# Patient Record
Sex: Female | Born: 1985 | Race: White | Hispanic: Yes | Marital: Married | Smoking: Never smoker
Health system: Southern US, Community
[De-identification: ages and names within clinical notes are randomized; demographics above are authoritative.]

## PROBLEM LIST (undated history)

## (undated) DIAGNOSIS — Z789 Other specified health status: Secondary | ICD-10-CM

## (undated) HISTORY — PX: NO PAST SURGERIES: SHX2092

---

## 2005-07-16 ENCOUNTER — Inpatient Hospital Stay (HOSPITAL_COMMUNITY): Admission: AD | Admit: 2005-07-16 | Discharge: 2005-07-16 | Payer: Self-pay | Admitting: Obstetrics & Gynecology

## 2005-07-18 ENCOUNTER — Inpatient Hospital Stay (HOSPITAL_COMMUNITY): Admission: AD | Admit: 2005-07-18 | Discharge: 2005-07-18 | Payer: Self-pay | Admitting: *Deleted

## 2005-07-28 ENCOUNTER — Inpatient Hospital Stay (HOSPITAL_COMMUNITY): Admission: AD | Admit: 2005-07-28 | Discharge: 2005-07-28 | Payer: Self-pay | Admitting: Obstetrics and Gynecology

## 2005-11-15 ENCOUNTER — Emergency Department (HOSPITAL_COMMUNITY): Admission: EM | Admit: 2005-11-15 | Discharge: 2005-11-15 | Payer: Self-pay | Admitting: Emergency Medicine

## 2005-11-15 ENCOUNTER — Inpatient Hospital Stay (HOSPITAL_COMMUNITY): Admission: AD | Admit: 2005-11-15 | Discharge: 2005-11-15 | Payer: Self-pay | Admitting: Family Medicine

## 2014-01-09 ENCOUNTER — Other Ambulatory Visit (HOSPITAL_COMMUNITY): Payer: Self-pay | Admitting: *Deleted

## 2014-01-09 DIAGNOSIS — N6320 Unspecified lump in the left breast, unspecified quadrant: Secondary | ICD-10-CM

## 2014-01-28 ENCOUNTER — Encounter (HOSPITAL_COMMUNITY): Payer: Self-pay | Admitting: *Deleted

## 2014-01-30 ENCOUNTER — Ambulatory Visit (HOSPITAL_COMMUNITY)
Admission: RE | Admit: 2014-01-30 | Discharge: 2014-01-30 | Disposition: A | Discharge status: Home / Self Care | Payer: Self-pay | Source: Ambulatory Visit | Attending: Obstetrics and Gynecology | Admitting: Obstetrics and Gynecology

## 2014-01-30 ENCOUNTER — Ambulatory Visit
Admission: RE | Admit: 2014-01-30 | Discharge: 2014-01-30 | Disposition: A | Discharge status: Home / Self Care | Payer: No Typology Code available for payment source | Source: Ambulatory Visit | Attending: Obstetrics and Gynecology | Admitting: Obstetrics and Gynecology

## 2014-01-30 ENCOUNTER — Other Ambulatory Visit (HOSPITAL_COMMUNITY): Payer: Self-pay | Admitting: Obstetrics and Gynecology

## 2014-01-30 ENCOUNTER — Encounter (HOSPITAL_COMMUNITY): Payer: Self-pay

## 2014-01-30 VITALS — BP 108/64 | Ht 59.0 in | Wt 120.2 lb

## 2014-01-30 DIAGNOSIS — Z01419 Encounter for gynecological examination (general) (routine) without abnormal findings: Secondary | ICD-10-CM

## 2014-01-30 DIAGNOSIS — N6320 Unspecified lump in the left breast, unspecified quadrant: Secondary | ICD-10-CM

## 2014-01-30 DIAGNOSIS — N898 Other specified noninflammatory disorders of vagina: Secondary | ICD-10-CM

## 2014-01-30 DIAGNOSIS — N6321 Unspecified lump in the left breast, upper outer quadrant: Secondary | ICD-10-CM

## 2014-01-30 NOTE — Patient Instructions (Signed)
Explained to Morgan Lawson that [place] will cover Pap smears and co-testing every 5 years unless has a history of abnormal Pap smears. Referred patient to the [facility] for a left breast ultrasound. Appointment scheduled for January 30, 2014 at 1340. Patient aware of appointment and will be there. Let patient know will follow up with her within the next couple weeks with results of Pap smear and wet prep by phone. Morgan Lawson verbalized understanding.

## 2014-01-30 NOTE — Addendum Note (Signed)
Encounter addended by: Loletta Parish, RN on: 01/30/2014  3:57 PM<BR>     Documentation filed: Visit Diagnoses

## 2014-01-30 NOTE — Progress Notes (Signed)
Patient referred to [facility] by the [facility] Department to follow-up for a left breast lump.   Pap Smear:  Completed Pap smear today. Last Pap smear was 10/22/2010 at the [facility] and normal. Per patient has no history of an abnormal Pap smear. Last Pap smear result is scanned into EPIC under media.  Physical exam: Breasts Breasts symmetrical. No skin abnormalities bilateral breasts. No nipple retraction bilateral breasts. No nipple discharge bilateral breasts. No lymphadenopathy. No lumps palpated right breast. Palpated a moveable left breast lump at 2 o'clock 6 cm from the nipple. No complaints of pain or tenderness on exam. Referred patient to the [facility] for a left breast ultrasound. Appointment scheduled for January 30, 2014 at 1340.         Pelvic/Bimanual   Ext Genitalia No lesions, no swelling and no discharge observed on external genitalia.         Vagina Vagina pink and normal texture. No lesions and thick white discharge observed in vagina. Wet prep completed.          Cervix Cervix is present. Cervix pink and of normal texture. Cervix friable. Small amount of thick white discharge observed on cervix.       Uterus Uterus is present and palpable. Uterus in normal position and normal size.       Adnexae Bilateral ovaries present and palpable. No tenderness on palpation.        Rectovaginal No rectal exam completed today since patient had no rectal complaints. Small hemorrhoid observed on rectal area.

## 2014-01-30 NOTE — Addendum Note (Signed)
Encounter addended by: Rondell Reams, LPN on: 18/34/3735  7:89 PM<BR>     Documentation filed: Dx Association, Orders

## 2014-02-03 LAB — CYTOLOGY - PAP

## 2014-02-11 ENCOUNTER — Telehealth (HOSPITAL_COMMUNITY): Payer: Self-pay | Admitting: *Deleted

## 2014-02-11 NOTE — Telephone Encounter (Signed)
Telephoned patient at home # and discussed negative pap smear results. HPV was negative. Wet prep was also negative. Next pap due in 3 years. Patient voiced understanding.

## 2014-02-12 ENCOUNTER — Other Ambulatory Visit (HOSPITAL_COMMUNITY): Payer: Self-pay | Admitting: Obstetrics and Gynecology

## 2014-02-12 DIAGNOSIS — N6320 Unspecified lump in the left breast, unspecified quadrant: Secondary | ICD-10-CM

## 2014-02-17 ENCOUNTER — Ambulatory Visit
Admission: RE | Admit: 2014-02-17 | Discharge: 2014-02-17 | Disposition: A | Discharge status: Home / Self Care | Payer: No Typology Code available for payment source | Source: Ambulatory Visit | Attending: Obstetrics and Gynecology | Admitting: Obstetrics and Gynecology

## 2014-02-17 DIAGNOSIS — N6320 Unspecified lump in the left breast, unspecified quadrant: Secondary | ICD-10-CM

## 2014-02-18 ENCOUNTER — Encounter (HOSPITAL_COMMUNITY): Payer: Self-pay | Admitting: *Deleted

## 2014-07-15 ENCOUNTER — Other Ambulatory Visit: Payer: Self-pay

## 2014-07-15 ENCOUNTER — Other Ambulatory Visit (HOSPITAL_COMMUNITY): Payer: Self-pay | Admitting: *Deleted

## 2014-07-15 DIAGNOSIS — N6320 Unspecified lump in the left breast, unspecified quadrant: Secondary | ICD-10-CM

## 2014-08-07 ENCOUNTER — Ambulatory Visit
Admission: RE | Admit: 2014-08-07 | Discharge: 2014-08-07 | Disposition: A | Discharge status: Home / Self Care | Payer: No Typology Code available for payment source | Source: Ambulatory Visit | Attending: Obstetrics and Gynecology | Admitting: Obstetrics and Gynecology

## 2014-08-07 ENCOUNTER — Ambulatory Visit (HOSPITAL_COMMUNITY)
Admission: RE | Admit: 2014-08-07 | Discharge: 2014-08-07 | Disposition: A | Discharge status: Home / Self Care | Payer: Self-pay | Source: Ambulatory Visit | Attending: Obstetrics and Gynecology | Admitting: Obstetrics and Gynecology

## 2014-08-07 ENCOUNTER — Encounter (HOSPITAL_COMMUNITY): Payer: Self-pay

## 2014-08-07 VITALS — BP 102/60 | Temp 99.1°F | Ht 59.0 in | Wt 123.2 lb

## 2014-08-07 DIAGNOSIS — N6320 Unspecified lump in the left breast, unspecified quadrant: Secondary | ICD-10-CM

## 2014-08-07 DIAGNOSIS — Z1239 Encounter for other screening for malignant neoplasm of breast: Secondary | ICD-10-CM

## 2014-08-07 NOTE — Patient Instructions (Signed)
Educational materials on self breast awareness given. Explained to Morgan Lawson that [place] will cover Pap smears and co-testing every 5 years unless has a history of abnormal Pap smears. Referred patient to the [facility] for left breast ultrasound. Appointment scheduled for Thursday, Aug 07, 2014 at 1510. Patient aware of appointment and will be there. Morgan Lawson verbalized understanding.

## 2014-08-07 NOTE — Progress Notes (Addendum)
Complaints of left breast burning pain x 1 week that comes and goes. Patient rated pain at a 5 out of 10.  Pap Smear: Pap smear not completed today. Last Pap smear was 01/30/2014 at [facility] and normal with negative HPV. Per patient has no history of an abnormal Pap smear. Last two Pap smear results in EPIC.  Physical exam: Breasts Breasts symmetrical. No skin abnormalities bilateral breasts. No nipple retraction bilateral breasts. No nipple discharge bilateral breasts. No lymphadenopathy. No lumps palpated right breast. Palpated a lump within the left breast at 2:30 o'clock 5 cm from the nipple. Complaints of tenderness when palpated lump within the left breast. Referred patient to the [facility] for left breast ultrasound. Appointment scheduled for Thursday, Aug 07, 2014 at 1510.    Pelvic/Bimanual No Pap smear completed today since last Pap smear was 01/30/2014. Pap smear not indicated per BCCCP guidelines.   Used interpreter Benjamine Sprague.

## 2014-08-07 NOTE — Addendum Note (Signed)
Encounter addended by: Loletta Parish, RN on: 08/07/2014  3:25 PM<BR>     Documentation filed: Notes Section

## 2014-08-13 ENCOUNTER — Encounter (HOSPITAL_COMMUNITY): Payer: Self-pay | Admitting: Family Medicine

## 2014-08-13 ENCOUNTER — Emergency Department (HOSPITAL_COMMUNITY)
Admission: EM | Admit: 2014-08-13 | Discharge: 2014-08-13 | Disposition: A | Discharge status: Home / Self Care | Payer: Self-pay | Attending: Emergency Medicine | Admitting: Emergency Medicine

## 2014-08-13 ENCOUNTER — Emergency Department (HOSPITAL_COMMUNITY): Payer: Self-pay

## 2014-08-13 DIAGNOSIS — R079 Chest pain, unspecified: Secondary | ICD-10-CM

## 2014-08-13 DIAGNOSIS — R0789 Other chest pain: Secondary | ICD-10-CM | POA: Insufficient documentation

## 2014-08-13 LAB — CBC WITH DIFFERENTIAL/PLATELET
Basophils Absolute: 0 10*3/uL (ref 0.0–0.1)
Basophils Relative: 0 % (ref 0–1)
Eosinophils Absolute: 0.1 10*3/uL (ref 0.0–0.7)
Eosinophils Relative: 1 % (ref 0–5)
HEMATOCRIT: 42.6 % (ref 36.0–46.0)
HEMOGLOBIN: 15.1 g/dL — AB (ref 12.0–15.0)
LYMPHS ABS: 2.1 10*3/uL (ref 0.7–4.0)
Lymphocytes Relative: 25 % (ref 12–46)
MCH: 30.2 pg (ref 26.0–34.0)
MCHC: 35.4 g/dL (ref 30.0–36.0)
MCV: 85.2 fL (ref 78.0–100.0)
MONO ABS: 0.5 10*3/uL (ref 0.1–1.0)
Monocytes Relative: 6 % (ref 3–12)
Neutro Abs: 5.8 10*3/uL (ref 1.7–7.7)
Neutrophils Relative %: 68 % (ref 43–77)
PLATELETS: 247 10*3/uL (ref 150–400)
RBC: 5 MIL/uL (ref 3.87–5.11)
RDW: 12.7 % (ref 11.5–15.5)
WBC: 8.5 10*3/uL (ref 4.0–10.5)

## 2014-08-13 LAB — BASIC METABOLIC PANEL
ANION GAP: 8 (ref 5–15)
BUN: 9 mg/dL (ref 6–20)
CO2: 22 mmol/L (ref 22–32)
CREATININE: 0.45 mg/dL (ref 0.44–1.00)
Calcium: 9.1 mg/dL (ref 8.9–10.3)
Chloride: 106 mmol/L (ref 101–111)
GFR calc non Af Amer: >60 mL/min (ref >60)
Glucose, Bld: 86 mg/dL (ref 65–99)
POTASSIUM: 3.9 mmol/L (ref 3.5–5.1)
SODIUM: 136 mmol/L (ref 135–145)

## 2014-08-13 LAB — D-DIMER, QUANTITATIVE (NOT AT ARMC)

## 2014-08-13 MED ORDER — IBUPROFEN 600 MG PO TABS: 600.0000 mg | ORAL_TABLET | Freq: Four times a day (QID) | ORAL | Status: DC | PRN

## 2014-08-13 NOTE — ED Notes (Signed)
Pt is in stable condition upon d/c and ambulates from ED. 

## 2014-08-13 NOTE — Discharge Instructions (Signed)
Dolor de la pared torcica (Chest Wall Pain) Dolor en la pared torcica es dolor en o alrededor de los huesos y msculos de su pecho. Podrn pasar hasta 6 semanas hasta que comience a mejorar. Puede demorar ms tiempo si es fsicamente activo en su [place].  CAUSAS  El dolor en el pecho puede aparecer sin motivo. No obstante, algunas causas pueden ser:   Ardelia Mems enfermedad viral como la gripe.  Traumatismos.  Tos.  La prctica de ejercicios.  Artritis.  Fibromialgia  [place]. INSTRUCCIONES PARA EL CUIDADO DOMICILIARIO  Evite hacer actividad fsica extenuante. Trate de no esforzarse o Librarian, academic. Aqu se incluyen las actividades en las que [place] los msculos del trax, los abdominales y los msculos laterales, especialmente si debe levantar objetos pesados.  Aplique hielo sobre la zona dolorida.  Ponga el hielo en una bolsa plstica.  Colquese una toalla entre la piel y la bolsa de hielo.  Deje la bolsa de hielo durante 15 a 20 minutos por hora, durante los primeros 2 das.  Utilice los medicamentos de venta libre o de prescripcin para Conservation officer, historic buildings, Health and safety inspector o la [place], segn se lo indique el profesional que lo asiste. SOLICITE ATENCIN MDICA DE INMEDIATO SI:  El dolor aumenta o siente muchas molestias.  Tiene fiebre.  El dolor de [place].  Desarrolla nuevos e inexplicables sntomas.  Tiene nuseas o vmitos.  Philbert Riser o se siente mareado.  Tiene tos con flema (esputo), o tose con sangre. EST SEGURO QUE:   Comprende las instrucciones para el alta mdica.  Controlar su enfermedad.  Solicitar atencin mdica de inmediato segn las indicaciones. Document Released: 04/11/2006 Document Revised: 05/23/2011 [facility] Patient Information 2015 [place]. This information is not intended to replace advice given to you by your health care provider. Make sure you discuss any questions you have with your health care  provider.

## 2014-08-13 NOTE — ED Notes (Signed)
Pt here for left sided chest pain through to her back. sts some pain in the arm for the last few weeks. sts the left leg is numb. She was see recently and told the lump in breast is not related to pain. sts hurts to touch and with breathing.

## 2014-08-13 NOTE — ED Provider Notes (Signed)
CSN: [id number]     Arrival date & time 08/13/14  0347 History   First MD Initiated Contact with Patient 08/13/14 [phone]     Chief Complaint  Patient presents with  . Chest Pain     (Consider location/radiation/quality/duration/timing/severity/associated sxs/prior Treatment) HPI Comments: Patient with no PMH presents to the ED with chief complaint of chest pain 1 month. She states the pain is located on the left side of her chest. She reports associated pain with deep breathing as well as with palpation. She states that occasionally she has numbness that radiates to her left arm and to her left leg. She denies any known mechanism of injury. She states that she was seen by her regular doctor, and was prescribed an anti-inflammatory for chest wall inflammation. She states that this helped a little bit. She states that she is concerned about the persistent nature of the pain. She denies fevers, chills, cough, shortness breath, or abdominal pain. She denies any history of ACS or PE or DVT. She is currently using birth control.  The history is provided by the patient. No language interpreter was used.    History reviewed. No pertinent past medical history. History reviewed. No pertinent past surgical history. Family History  Problem Relation Age of Onset  . Seizures Sister   . Diabetes Paternal Grandfather    History  Substance Use Topics  . Smoking status: Never Smoker   . Smokeless tobacco: Never Used  . Alcohol Use: No   OB History    Gravida Para Term Preterm AB TAB SAB Ectopic Multiple Living   2 2 2       2      Review of Systems  Constitutional: Negative for fever and chills.  Respiratory: Negative for shortness of breath.   Cardiovascular: Positive for chest pain.  Gastrointestinal: Negative for nausea, vomiting, diarrhea and constipation.  Genitourinary: Negative for dysuria.  All other systems reviewed and are negative.     Allergies  Review of patient's allergies  indicates no known allergies.  Home Medications   Prior to Admission medications   Medication Sig Start Date End Date Taking? Authorizing Provider  ibuprofen (ADVIL,MOTRIN) 200 MG tablet Take 400 mg by mouth every 6 (six) hours as needed for mild pain or moderate pain.   Yes Historical Provider, MD   BP 109/70 mmHg  Pulse 81  Temp(Src) 98.7 F (37.1 C) (Oral)  Resp 18  SpO2 99%  LMP 08/07/2014 Physical Exam  Constitutional: She is oriented to person, place, and time. She appears well-developed and well-nourished.  HENT:  Head: Normocephalic and atraumatic.  Eyes: Conjunctivae and EOM are normal. Pupils are equal, round, and reactive to light.  Neck: Normal range of motion. Neck supple.  Cardiovascular: Normal rate and regular rhythm.  Exam reveals no gallop and no friction rub.   No murmur heard. Pulmonary/Chest: Effort normal and breath sounds normal. No respiratory distress. She has no wheezes. She has no rales. She exhibits no tenderness.  Clear to auscultation bilaterally  Abdominal: Soft. Bowel sounds are normal. She exhibits no distension and no mass. There is no tenderness. There is no rebound and no guarding.  No abdominal tenderness  Musculoskeletal: Normal range of motion. She exhibits no edema or tenderness.  No leg swelling, or leg tenderness  Neurological: She is alert and oriented to person, place, and time.  Skin: Skin is warm and dry.  Psychiatric: She has a normal mood and affect. Her behavior is normal. Judgment and thought content  normal.  Nursing note and vitals reviewed.   ED Course  Procedures (including critical care time) Results for orders placed or performed during the hospital encounter of 08/13/14  CBC with Differential/Platelet  Result Value Ref Range   WBC 8.5 4.0 - 10.5 K/uL   RBC 5.00 3.87 - 5.11 MIL/uL   Hemoglobin 15.1 (H) 12.0 - 15.0 g/dL   HCT 42.6 36.0 - 46.0 %   MCV 85.2 78.0 - 100.0 fL   MCH 30.2 26.0 - 34.0 pg   MCHC 35.4 30.0 -  36.0 g/dL   RDW 12.7 11.5 - 15.5 %   Platelets 247 150 - 400 K/uL   Neutrophils Relative % 68 43 - 77 %   Neutro Abs 5.8 1.7 - 7.7 K/uL   Lymphocytes Relative 25 12 - 46 %   Lymphs Abs 2.1 0.7 - 4.0 K/uL   Monocytes Relative 6 3 - 12 %   Monocytes Absolute 0.5 0.1 - 1.0 K/uL   Eosinophils Relative 1 0 - 5 %   Eosinophils Absolute 0.1 0.0 - 0.7 K/uL   Basophils Relative 0 0 - 1 %   Basophils Absolute 0.0 0.0 - 0.1 K/uL  Basic metabolic panel  Result Value Ref Range   Sodium 136 135 - 145 mmol/L   Potassium 3.9 3.5 - 5.1 mmol/L   Chloride 106 101 - 111 mmol/L   CO2 22 22 - 32 mmol/L   Glucose, Bld 86 65 - 99 mg/dL   BUN 9 6 - 20 mg/dL   Creatinine, Ser 0.45 0.44 - 1.00 mg/dL   Calcium 9.1 8.9 - 10.3 mg/dL   GFR calc non Af Amer >60 >60 mL/min   GFR calc Af Amer >60 >60 mL/min   Anion gap 8 5 - 15  D-dimer, quantitative (not at [facility])  Result Value Ref Range   D-Dimer, Quant <0.27 0.00 - 0.48 ug/mL-FEU   Dg Chest 2 View  08/13/2014   CLINICAL DATA:  Chest pain for several days  EXAM: CHEST  2 VIEW  COMPARISON:  None.  FINDINGS: Lungs are clear. Heart size and pulmonary vascularity are normal. No adenopathy. No pneumothorax. No bone lesions.  IMPRESSION: No edema or consolidation.   Electronically Signed   By: Lowella Grip III M.D.   On: 08/13/2014 10:16   US Breast Ltd Uni Left Inc Axilla  08/07/2014   CLINICAL DATA:  Patient has waxing and waning breast pain in the lateral aspect of the left breast. Patient had a mass in the upper outer left breast biopsied in December 2015. Pathology revealed a fibroadenoma which is concordant.  EXAM: ULTRASOUND OF THE LEFT BREAST  COMPARISON:  01/30/2014  FINDINGS: On physical exam, there is a palpable mobile smooth mass in the upper outer left breast reflecting the previously biopsied mass. No other discrete palpable mass.  Targeted ultrasound is performed, showing a string of 3 masses with similar echogenicity, all circumscribed, extending  peripherally from the 2:30 position of the left breast. The largest is the previously biopsied mass which measures 1.9 cm x 1.1 cm x 2.4 cm, without significant change allowing differences in measurement technique. The other 2 masses lie adjacent to this, not previously measured, the middle mass measuring 10 mm x 5 mm x 15 mm an the most peripheral mass measuring 8 mm x 4 mm x 11 mm. These have not been ankle echogenicity and are all felt to reflect fibroadenomas.  No abnormality is seen on ultrasound in the lateral left breast  in the region of the will reported pain. Patient is currently not having pain.  IMPRESSION: Benign left breast fibroadenomas.  No evidence of malignancy.  RECOMMENDATION: Screening mammogram at age 19 unless there are persistent or intervening clinical concerns. (Code:SM-B-40A)  I have discussed the findings and recommendations with the patient. Results were also provided in writing at the conclusion of the visit. If applicable, a reminder letter will be sent to the patient regarding the next appointment.  BI-RADS CATEGORY  2: Benign Finding(s)   Electronically Signed   By: Lajean Manes M.D.   On: 08/07/2014 16:05        EKG Interpretation   Date/Time:  Wednesday August 13 2014 08:54:20 EDT Ventricular Rate:  81 PR Interval:  140 QRS Duration: 90 QT Interval:  374 QTC Calculation: 434 R Axis:   82 Text Interpretation:  Normal sinus rhythm Cannot rule out Anterior infarct  , age undetermined Abnormal ECG No previous ECGs available Confirmed by  Christy Gentles  MD, DONALD ([id number]) on 08/13/2014 9:13:55 AM      MDM   Final diagnoses:  Chest pain  Chest wall pain    Patient with persistent chest pain x 1 month. Low risk for ACS, PE, DVT.  Seems to be pleuritic in nature.  NSAIDs have helped in the past.  Consider D-dimer given that PERC cannot be applied to patient.  D-dimer is negative. Chest pain reproducible with palpation. Will treat with NSAIDs, and discharged home.  Return precautions given. Patient understands and agrees with plan. She is stable and ready for discharge.    Montine Circle, PA-C 08/13/14 1356  Ripley Fraise, MD 08/13/14 1420

## 2015-08-04 ENCOUNTER — Other Ambulatory Visit (HOSPITAL_COMMUNITY): Payer: Self-pay | Admitting: *Deleted

## 2015-08-04 DIAGNOSIS — N644 Mastodynia: Secondary | ICD-10-CM

## 2015-08-05 ENCOUNTER — Emergency Department (HOSPITAL_COMMUNITY)
Admission: EM | Admit: 2015-08-05 | Discharge: 2015-08-05 | Disposition: A | Discharge status: Home / Self Care | Payer: No Typology Code available for payment source | Attending: Emergency Medicine | Admitting: Emergency Medicine

## 2015-08-05 ENCOUNTER — Encounter (HOSPITAL_COMMUNITY): Payer: Self-pay

## 2015-08-05 DIAGNOSIS — Y92410 Unspecified street and highway as the place of occurrence of the external cause: Secondary | ICD-10-CM | POA: Insufficient documentation

## 2015-08-05 DIAGNOSIS — Y998 Other external cause status: Secondary | ICD-10-CM | POA: Diagnosis not present

## 2015-08-05 DIAGNOSIS — S3992XA Unspecified injury of lower back, initial encounter: Secondary | ICD-10-CM | POA: Diagnosis not present

## 2015-08-05 DIAGNOSIS — Y9389 Activity, other specified: Secondary | ICD-10-CM | POA: Diagnosis not present

## 2015-08-05 DIAGNOSIS — S0990XA Unspecified injury of head, initial encounter: Secondary | ICD-10-CM | POA: Insufficient documentation
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
## 2015-08-05 NOTE — ED Notes (Signed)
Pt c/o some head pain and lower back pain

## 2015-08-13 ENCOUNTER — Ambulatory Visit
Admission: RE | Admit: 2015-08-13 | Discharge: 2015-08-13 | Disposition: A | Discharge status: Home / Self Care | Payer: No Typology Code available for payment source | Source: Ambulatory Visit | Attending: Obstetrics and Gynecology | Admitting: Obstetrics and Gynecology

## 2015-08-13 ENCOUNTER — Ambulatory Visit (HOSPITAL_COMMUNITY)
Admission: RE | Admit: 2015-08-13 | Discharge: 2015-08-13 | Disposition: A | Discharge status: Home / Self Care | Payer: Self-pay | Source: Ambulatory Visit | Attending: Obstetrics and Gynecology | Admitting: Obstetrics and Gynecology

## 2015-08-13 ENCOUNTER — Encounter (HOSPITAL_COMMUNITY): Payer: Self-pay

## 2015-08-13 VITALS — BP 122/80 | Temp 98.4°F | Ht 63.0 in | Wt 123.6 lb

## 2015-08-13 DIAGNOSIS — N644 Mastodynia: Secondary | ICD-10-CM

## 2015-08-13 DIAGNOSIS — Z1239 Encounter for other screening for malignant neoplasm of breast: Secondary | ICD-10-CM

## 2015-08-13 DIAGNOSIS — N6320 Unspecified lump in the left breast, unspecified quadrant: Secondary | ICD-10-CM

## 2015-08-13 NOTE — Progress Notes (Addendum)
Complaints of left breast pain x 2-3 months that comes and goes. Patient rates pain at a 6 out of 10. Patient complained that she still feels the left breast lump that has been followed since November 2015. Patient states it has increased in size since last mammogram.  Pap Smear: Pap smear not completed today. Last Pap smear was 01/30/2014 at [facility] and normal with negative HPV. Per patient has no history of an abnormal Pap smear. Last two Pap smear results in EPIC.  Physical exam: Breasts Breasts symmetrical. No skin abnormalities bilateral breasts. No nipple retraction bilateral breasts. No nipple discharge bilateral breasts. No lymphadenopathy. No lumps palpated right breast. Palpated a lump within the left breast between 2-3 o'clock. Patient complained of tenderness when palpated lump. Referred patient to the [facility] for diagnostic mammogram. Appointment scheduled for Thursday, August 13, 2015 at 0830.        Pelvic/Bimanual No Pap smear completed today since last Pap smear and HPV typing was 01/30/2014. Pap smear not indicated per BCCCP guidelines.   Smoking History: Patient has never smoked.  Patient Navigation: Patient education provided. Access to services provided for patient through [facility] program. Spanish interpreter provided.    Used Spanish interpreter Omer Jack from [employer].

## 2015-08-13 NOTE — Patient Instructions (Signed)
Educational materials on self breast awareness given. Explained to Morgan Lawson that she did not need a Pap smear today due to last Pap smear and HPV typing was completed 04/01/2013. Let her know [facility] will cover Pap smears and HPV typing every 5 years unless has a history of abnormal Pap smears. Referred patient to the [facility] for diagnostic mammogram. Appointment scheduled for Thursday, August 13, 2015 at 0830. Morgan Lawson verbalized understanding.  [place], Arvil Chaco, RN 8:47 AM

## 2015-08-14 ENCOUNTER — Encounter (HOSPITAL_COMMUNITY): Payer: Self-pay | Admitting: *Deleted

## 2015-09-09 NOTE — Addendum Note (Signed)
Encounter addended by: Loletta Parish, RN on: 09/09/2015  3:23 PM<BR>     Documentation filed: Notes Section

## 2015-12-03 ENCOUNTER — Inpatient Hospital Stay (HOSPITAL_COMMUNITY): Payer: Self-pay

## 2015-12-03 ENCOUNTER — Ambulatory Visit (HOSPITAL_COMMUNITY)
Admission: EM | Admit: 2015-12-03 | Discharge: 2015-12-03 | Disposition: A | Discharge status: Home / Self Care | Payer: Self-pay

## 2015-12-03 ENCOUNTER — Encounter (HOSPITAL_COMMUNITY): Payer: Self-pay | Admitting: *Deleted

## 2015-12-03 ENCOUNTER — Inpatient Hospital Stay (HOSPITAL_COMMUNITY)
Admission: AD | Admit: 2015-12-03 | Discharge: 2015-12-03 | Disposition: A | Discharge status: Home / Self Care | Payer: Self-pay | Source: Ambulatory Visit | Attending: Obstetrics & Gynecology | Admitting: Obstetrics & Gynecology

## 2015-12-03 ENCOUNTER — Ambulatory Visit (HOSPITAL_COMMUNITY)
Admission: EM | Admit: 2015-12-03 | Discharge: 2015-12-03 | Disposition: A | Discharge status: Nursing Home (Includes ICF) | Payer: Self-pay | Attending: Internal Medicine | Admitting: Internal Medicine

## 2015-12-03 ENCOUNTER — Encounter (HOSPITAL_COMMUNITY): Payer: Self-pay | Admitting: Emergency Medicine

## 2015-12-03 DIAGNOSIS — R102 Pelvic and perineal pain: Secondary | ICD-10-CM

## 2015-12-03 DIAGNOSIS — Z3201 Encounter for pregnancy test, result positive: Secondary | ICD-10-CM

## 2015-12-03 DIAGNOSIS — N939 Abnormal uterine and vaginal bleeding, unspecified: Secondary | ICD-10-CM

## 2015-12-03 DIAGNOSIS — O209 Hemorrhage in early pregnancy, unspecified: Secondary | ICD-10-CM | POA: Insufficient documentation

## 2015-12-03 DIAGNOSIS — O4691 Antepartum hemorrhage, unspecified, first trimester: Secondary | ICD-10-CM

## 2015-12-03 DIAGNOSIS — Z3A09 9 weeks gestation of pregnancy: Secondary | ICD-10-CM | POA: Insufficient documentation

## 2015-12-03 DIAGNOSIS — O3680X0 Pregnancy with inconclusive fetal viability, not applicable or unspecified: Secondary | ICD-10-CM

## 2015-12-03 HISTORY — DX: Other specified health status: Z78.9

## 2015-12-03 LAB — CBC
HCT: 39.1 % (ref 36.0–46.0)
Hemoglobin: 13.9 g/dL (ref 12.0–15.0)
MCH: 30.8 pg (ref 26.0–34.0)
MCHC: 35.5 g/dL (ref 30.0–36.0)
MCV: 86.7 fL (ref 78.0–100.0)
Platelets: 267 10*3/uL (ref 150–400)
RBC: 4.51 MIL/uL (ref 3.87–5.11)
RDW: 13.2 % (ref 11.5–15.5)
WBC: 7.7 10*3/uL (ref 4.0–10.5)

## 2015-12-03 LAB — ABO/RH: ABO/RH(D): O POS

## 2015-12-03 LAB — URINALYSIS, ROUTINE W REFLEX MICROSCOPIC
Bilirubin Urine: NEGATIVE
GLUCOSE, UA: NEGATIVE mg/dL
Ketones, ur: NEGATIVE mg/dL
Leukocytes, UA: NEGATIVE
Nitrite: NEGATIVE
PH: 7.5 (ref 5.0–8.0)
Protein, ur: NEGATIVE mg/dL
SPECIFIC GRAVITY, URINE: 1.015 (ref 1.005–1.030)

## 2015-12-03 LAB — HCG, QUANTITATIVE, PREGNANCY: hCG, Beta Chain, Quant, S: 59 m[IU]/mL — ABNORMAL HIGH (ref <5)

## 2015-12-03 LAB — WET PREP, GENITAL
Clue Cells Wet Prep HPF POC: NONE SEEN
SPERM: NONE SEEN
TRICH WET PREP: NONE SEEN
YEAST WET PREP: NONE SEEN

## 2015-12-03 LAB — POCT PREGNANCY, URINE: Preg Test, Ur: POSITIVE — AB

## 2015-12-03 LAB — URINE MICROSCOPIC-ADD ON

## 2015-12-03 NOTE — ED Notes (Signed)
[facility] Was told to go immediately and not to stop to eat... Pt verb understanding.

## 2015-12-03 NOTE — MAU Provider Note (Signed)
History     CSN: [id number]  Arrival date and time: 12/03/15 1259   First Provider Initiated Contact with Patient 12/03/15 1343        Chief Complaint  Patient presents with  . Vaginal Bleeding  . Pelvic Pain   HPI  Morgan Lawson is a 30 y.o. G3P2002 at [redacted]w[redacted]d by LMP who presents with abdominal pain and vaginal bleeding. LMP was mid July; the end of July she had Nexplanon placed at [facility]. Reports vaginal bleeding that started as a "light period" on Saturday. On Sunday & Monday bleeding became heavy with clots. Since then reports brown spotting.  Lower abdominal cramping started Saturday as well. Pain is across lower abdomen & radiates to low back. Rates pain 6/10. Has been taking ibuprofen with minimal relief.  Some nausea & vomiting, last vomited a few days ago. Denies fever, dysuria, diarrhea, or constipation. Went to Urgent Care today & was sent here d/t positive UPT.   Spanish speaking patient; spanish interpreter at bedside.     OB History    Gravida Para Term Preterm AB Living   3 2 2     2    SAB TAB Ectopic Multiple Live Births           2      History reviewed. No pertinent past medical history.  History reviewed. No pertinent surgical history.  Family History  Problem Relation Age of Onset  . Seizures Sister   . Diabetes Paternal Grandfather     Social History  Substance Use Topics  . Smoking status: Never Smoker  . Smokeless tobacco: Never Used  . Alcohol use No    Allergies: No Known Allergies  Prescriptions Prior to Admission  Medication Sig Dispense Refill Last Dose  . ibuprofen (ADVIL,MOTRIN) 200 MG tablet Take 400 mg by mouth every 6 (six) hours as needed for headache, mild pain or moderate pain.   Past Week at Unknown time    Review of Systems  Constitutional: Negative.   Gastrointestinal: Positive for abdominal pain and nausea. Negative for constipation, diarrhea and vomiting.  Genitourinary: Negative for dysuria.       +  vaginal bleeding  Musculoskeletal: Positive for back pain.   Physical Exam   Blood pressure 132/63, pulse 97, temperature 98.9 F (37.2 C), resp. rate 18, weight 123 lb 12.8 oz (56.2 kg), last menstrual period 07/24/2015.  Physical Exam  Nursing note and vitals reviewed. Constitutional: She is oriented to person, place, and time. She appears well-developed and well-nourished. No distress.  HENT:  Head: Normocephalic and atraumatic.  Eyes: Conjunctivae are normal. Right eye exhibits no discharge. Left eye exhibits no discharge. No scleral icterus.  Neck: Normal range of motion.  Cardiovascular: Normal rate, regular rhythm and normal heart sounds.   No murmur heard. Respiratory: Effort normal and breath sounds normal. No respiratory distress. She has no wheezes.  GI: Soft. Bowel sounds are normal. She exhibits no distension. There is no tenderness. There is no rebound and no guarding.  Genitourinary: Uterus normal. Cervix exhibits no motion tenderness and no friability. Right adnexum displays no mass and no tenderness. Left adnexum displays no mass and no tenderness. There is bleeding (minimal amount of old blood, brown) in the vagina.  Neurological: She is alert and oriented to person, place, and time.  Skin: Skin is warm and dry. She is not diaphoretic.  Psychiatric: She has a normal mood and affect. Her behavior is normal. Judgment and thought content normal.  MAU Course  Procedures Results for orders placed or performed during the hospital encounter of 12/03/15 (from the past 24 hour(s))  CBC     Status: None   Collection Time: 12/03/15  1:23 PM  Result Value Ref Range   WBC 7.7 4.0 - 10.5 K/uL   RBC 4.51 3.87 - 5.11 MIL/uL   Hemoglobin 13.9 12.0 - 15.0 g/dL   HCT 39.1 36.0 - 46.0 %   MCV 86.7 78.0 - 100.0 fL   MCH 30.8 26.0 - 34.0 pg   MCHC 35.5 30.0 - 36.0 g/dL   RDW 13.2 11.5 - 15.5 %   Platelets 267 150 - 400 K/uL  ABO/Rh     Status: None   Collection Time: 12/03/15   1:24 PM  Result Value Ref Range   ABO/RH(D) O POS   hCG, quantitative, pregnancy     Status: Abnormal   Collection Time: 12/03/15  1:24 PM  Result Value Ref Range   hCG, Beta Chain, Quant, S 59 (H) <5 mIU/mL  Wet prep, genital     Status: Abnormal   Collection Time: 12/03/15  1:55 PM  Result Value Ref Range   Yeast Wet Prep HPF POC NONE SEEN NONE SEEN   Trich, Wet Prep NONE SEEN NONE SEEN   Clue Cells Wet Prep HPF POC NONE SEEN NONE SEEN   WBC, Wet Prep HPF POC FEW (A) NONE SEEN   Sperm NONE SEEN   Urinalysis, Routine w reflex microscopic (not at [facility] [place])     Status: Abnormal   Collection Time: 12/03/15  3:45 PM  Result Value Ref Range   Color, Urine YELLOW YELLOW   APPearance CLEAR CLEAR   Specific Gravity, Urine 1.015 1.005 - 1.030   pH 7.5 5.0 - 8.0   Glucose, UA NEGATIVE NEGATIVE mg/dL   Hgb urine dipstick LARGE (A) NEGATIVE   Bilirubin Urine NEGATIVE NEGATIVE   Ketones, ur NEGATIVE NEGATIVE mg/dL   Protein, ur NEGATIVE NEGATIVE mg/dL   Nitrite NEGATIVE NEGATIVE   Leukocytes, UA NEGATIVE NEGATIVE  Urine microscopic-add on     Status: Abnormal   Collection Time: 12/03/15  3:45 PM  Result Value Ref Range   Squamous Epithelial / LPF 0-5 (A) NONE SEEN   WBC, UA 0-5 0 - 5 WBC/hpf   RBC / HPF 0-5 0 - 5 RBC/hpf   Bacteria, UA RARE (A) NONE SEEN   US Ob Comp Less 14 Wks  Result Date: 12/03/2015 CLINICAL DATA:  Vaginal bleeding in early pregnancy. EXAM: OBSTETRIC <14 WK [place] AND TRANSVAGINAL OB US TECHNIQUE: Both transabdominal and transvaginal ultrasound examinations were performed for complete evaluation of the gestation as well as the maternal uterus, adnexal regions, and pelvic cul-de-sac. Transvaginal technique was performed to assess early pregnancy. COMPARISON:  None. FINDINGS: Intrauterine gestational sac: None Yolk sac:  Not visualize Embryo:  Not visualized Cardiac Activity: Heart Rate:   bpm MSD:   mm    w     d CRL:    mm    w    d                  [place] EDC: Subchorionic  hemorrhage:  None visualized. Maternal uterus/adnexae: Endometrium heterogeneous, measuring 14 mm in thickness. No adnexal masses or free fluid. IMPRESSION: No intrauterine pregnancy visualized. Differential considerations would include early intrauterine pregnancy too early to visualize, spontaneous abortion, or occult ectopic pregnancy. Recommend close clinical followup and serial quantitative beta HCGs and ultrasounds. Electronically Signed   By:  Rolm Baptise M.D.   On: 12/03/2015 15:36   US Ob Transvaginal  Result Date: 12/03/2015 CLINICAL DATA:  Vaginal bleeding in early pregnancy. EXAM: OBSTETRIC <14 WK [place] AND TRANSVAGINAL OB US TECHNIQUE: Both transabdominal and transvaginal ultrasound examinations were performed for complete evaluation of the gestation as well as the maternal uterus, adnexal regions, and pelvic cul-de-sac. Transvaginal technique was performed to assess early pregnancy. COMPARISON:  None. FINDINGS: Intrauterine gestational sac: None Yolk sac:  Not visualize Embryo:  Not visualized Cardiac Activity: Heart Rate:   bpm MSD:   mm    w     d CRL:    mm    w    d                  [place] EDC: Subchorionic hemorrhage:  None visualized. Maternal uterus/adnexae: Endometrium heterogeneous, measuring 14 mm in thickness. No adnexal masses or free fluid. IMPRESSION: No intrauterine pregnancy visualized. Differential considerations would include early intrauterine pregnancy too early to visualize, spontaneous abortion, or occult ectopic pregnancy. Recommend close clinical followup and serial quantitative beta HCGs and ultrasounds. Electronically Signed   By: Rolm Baptise M.D.   On: 12/03/2015 15:36    MDM +UPT UA, wet prep, GC/chlamydia, CBC, ABO/Rh, quant hCG, HIV, and [place] today to rule out ectopic pregnancy Ultrasound shows no IUP or adnexal mass -- BHCG only 59  This vaginal bleeding & abdominal pain could represent a normal pregnancy, spontaneous abortion, or even an ectopic pregnancy which can  be life-threatening. Cultures were obtained to rule out pelvic infection.   Assessment and Plan  A: 1. Pregnancy of unknown anatomic location   2. Vaginal bleeding in pregnancy, first trimester     P: Discharge home Return Saturday evening or Sunday morning for repeat BHCG Discussed reasons to return to [place] 12/03/2015, 1:42 PM

## 2015-12-03 NOTE — ED Provider Notes (Signed)
CSN: [id number]     Arrival date & time 12/03/15  1004 History   First MD Initiated Contact with Patient 12/03/15 1041     Chief Complaint  Patient presents with  . Dysmenorrhea   (Consider location/radiation/quality/duration/timing/severity/associated sxs/prior Treatment) 30 year old female presents to the urgent care with complaints of vaginal bleeding and pain for 5 days. History obtained via video interpreter. She states the pain is located in the suprapubic mid pelvic area. She is having vaginal bleeding associated with clots. She is using 30 more pads per day. She has taken ibuprofen for pelvic pain which has temporarily improved the pain. She was recently placed on Implanon and July.  Other complaints include headache, body aches. No vomiting. She states that she has an appointment with her PCP on October 3 but was also advised that she could come in early tomorrow morning to be seen without an appointment.      History reviewed. No pertinent past medical history. History reviewed. No pertinent surgical history. Family History  Problem Relation Age of Onset  . Seizures Sister   . Diabetes Paternal Grandfather    Social History  Substance Use Topics  . Smoking status: Never Smoker  . Smokeless tobacco: Never Used  . Alcohol use No   OB History    Gravida Para Term Preterm AB Living   2 2 2     2    SAB TAB Ectopic Multiple Live Births                 Review of Systems  Constitutional: Positive for activity change. Negative for fever.  HENT: Negative.   Respiratory: Negative.   Cardiovascular: Negative.   Gastrointestinal: Positive for abdominal pain.  Genitourinary: Positive for pelvic pain and vaginal bleeding. Negative for dysuria and frequency.  Musculoskeletal: Positive for myalgias.  Skin: Negative.   Neurological: Negative.     Allergies  Review of patient's allergies indicates no known allergies.  Home Medications   Prior to Admission medications    Not on File   Meds Ordered and Administered this Visit  Medications - No data to display  BP 112/68 (BP Location: Left Arm)   Pulse 78   Temp 98.4 F (36.9 C) (Oral)   Resp 18   SpO2 100%  No data found.   Physical Exam  Constitutional: She is oriented to person, place, and time. She appears well-developed and well-nourished. No distress.  HENT:  Head: Normocephalic and atraumatic.  Eyes: EOM are normal.  Neck: Normal range of motion. Neck supple.  Cardiovascular: Normal rate and regular rhythm.   Pulmonary/Chest: Effort normal. No respiratory distress.  Musculoskeletal: She exhibits no edema.  Neurological: She is alert and oriented to person, place, and time. She exhibits normal muscle tone.  Skin: Skin is warm and dry.  Psychiatric: She has a normal mood and affect.  Nursing note and vitals reviewed.   Urgent Care Course   Clinical Course    Procedures (including critical care time)  Labs Review Labs Reviewed  POCT PREGNANCY, URINE - Abnormal; Notable for the following:       Result Value   Preg Test, Ur POSITIVE (*)    All other components within normal limits  CBC    Imaging Review No results found.   Visual Acuity Review  Right Eye Distance:   Left Eye Distance:   Bilateral Distance:    Right Eye Near:   Left Eye Near:    Bilateral Near:  MDM   1. Pelvic pain in female   2. Abnormal uterine bleeding   3. Positive pregnancy test    Your pregnancy test is positive and you are having pain and bleeding. You are to go to the [facility] now.  Reported to [place] at the [facility] regarding discharged to their facility. Patient is also instructed be interpreter Shonna Chock in regards to going to [place], NP 12/03/15 1118    Janne Napoleon, NP 12/03/15 1128

## 2015-12-03 NOTE — Discharge Instructions (Signed)
Dolor abdominal durante el embarazo (Abdominal Pain During Pregnancy) El dolor de vientre (abdominal) es habitual durante el embarazo. Generalmente no se trata de un problema grave. Otras veces puede ser un signo de que algo no anda bien. Siempre comunquese con su mdico si tiene dolor abdominal. CUIDADOS EN EL HOGAR Controle el dolor para ver si hay cambios. Las indicaciones que siguen pueden ayudarla a sentirse mejor:  Optician, dispensing (relaciones sexuales) ni se coloque nada dentro de la vagina hasta que se sienta mejor.  Haga reposo hasta que el dolor se calme.  Si siente ganas de vomitar (nuseas ) beba lquidos claros. No consuma alimentos slidos hasta que se sienta mejor.  Slo tome los medicamentos que le haya indicado su mdico.  Cumpla con las visitas al mdico segn las indicaciones. SOLICITE AYUDA DE INMEDIATO SI:   Tiene un sangrado, pierde lquido o elimina trozos de tejido por la vagina.  Siente ms dolor o clicos.  Comienza a vomitar.  Siente dolor al orinar u observa sangre en la orina.  Tiene fiebre.  No siente que el beb se mueva mucho.  Se siente muy dbil o cree que va a desmayarse.  Tiene dificultad para respirar con o sin dolor en el vientre.  Siente un dolor de cabeza muy intenso y Social research officer, government en el vientre.  Observa que sale un lquido por la vagina y tiene dolor abdominal.  La materia fecal es lquida (diarrea).  El dolor en el viente no desaparece, o empeora, luego de hacer reposo. ASEGRESE DE QUE:   Comprende estas instrucciones.  Controlar su afeccin.  Recibir ayuda de inmediato si no mejora o si empeora.   Esta informacin no tiene Marine scientist el consejo del mdico. Asegrese de hacerle al mdico cualquier pregunta que tenga.   Document Released: 11/10/2010 Document Revised: 10/31/2012 Elsevier Interactive Patient Education 2016 [place] vaginal durante el Media planner (primer trimestre) (Vaginal Bleeding During  Pregnancy, First Trimester) Durante los primeros meses de Morgan Lawson, es comn tener una pequea hemorragia vaginal (manchas). A veces, la hemorragia es normal y no representa un problema, pero en algunas ocasiones es un sntoma de algo grave. Asegrese de decirle a su mdico de inmediato si tiene algn tipo de hemorragia vaginal. CUIDADOS EN EL HOGAR  Controle su afeccin para ver si hay cambios.  Siga las indicaciones de su mdico con respecto al [place] de actividad que [employer].  Si debe hacer reposo en cama:  Es posible que deba quedarse en cama y levantarse nicamente para ir al bao.  Quizs le permitan hacer [employer].  Si es necesario, planifique que alguien la ayude.  Alla German:  La cantidad de toallas higinicas que [place] cada da.  La frecuencia con la que se cambia las toallas higinicas.  Indique que tan empapados (saturados) estn.  No use tampones.  No se haga duchas vaginales.  No tenga relaciones sexuales ni orgasmos hasta que el mdico la autorice.  Si elimina tejido por la vagina, gurdelo para mostrrselo al [employer].  Tome los medicamentos solamente como se lo haya indicado el mdico.  No tome aspirina, ya que puede causar hemorragias.  Concurra a todas las visitas de control como se lo haya indicado el mdico. SOLICITE AYUDA SI:   Tiene una hemorragia vaginal.  Tiene clicos.  Tiene dolores de [place].  Tiene fiebre que no desaparece despus de Geophysical data processor. SOLICITE AYUDA DE INMEDIATO SI:   Siente clicos muy intensos en la espalda o en el vientre (abdomen).  [place]  cogulos grandes o tejido por la vagina.  Tiene ms hemorragia.  Se siente dbil o que va a desvanecerse.  Pierde el conocimiento (se desmaya).  Tiene escalofros.  Tiene una prdida importante o sale lquido a borbotones por la vagina.  Se desmaya mientras defeca. ASEGRESE DE QUE:  Comprende estas instrucciones.  Controlar su afeccin.  Recibir ayuda  de inmediato si no mejora o si empeora.   Esta informacin no tiene Marine scientist el consejo del mdico. Asegrese de hacerle al mdico cualquier pregunta que tenga.   Document Released: 07/15/2013 Elsevier Interactive Patient Education [employer].

## 2015-12-03 NOTE — MAU Note (Addendum)
Sent over from Urgent care . Pt had posiotve UPT. Pt has [facility] C/O abd pain/cramping and some spotting today but heavier bleeing yesterday and Tuesday.

## 2015-12-03 NOTE — ED Triage Notes (Signed)
Pt is here for heavy vaginal bleeding onset Saturday  Reports it began spotting and since Sunday, she has been using 2-3 pads/day... Sx also include: abd pain, weakness, BAs, fatigue  States she had Nexplanon inserted back in 10/08/15  Has appt w/[facility] on 10/3  A&O x4... NAD

## 2015-12-03 NOTE — Discharge Instructions (Signed)
Your pregnancy test is positive and you are having pain and bleeding. You are to go to the [facility] now.

## 2015-12-04 LAB — GC/CHLAMYDIA PROBE AMP (~~LOC~~) NOT AT ARMC
CHLAMYDIA, DNA PROBE: NEGATIVE
Neisseria Gonorrhea: NEGATIVE

## 2015-12-04 LAB — HIV ANTIBODY (ROUTINE TESTING W REFLEX): HIV SCREEN 4TH GENERATION: NONREACTIVE

## 2015-12-06 ENCOUNTER — Encounter (HOSPITAL_COMMUNITY): Payer: Self-pay | Admitting: *Deleted

## 2015-12-06 ENCOUNTER — Inpatient Hospital Stay (HOSPITAL_COMMUNITY)
Admission: AD | Admit: 2015-12-06 | Discharge: 2015-12-06 | Disposition: A | Discharge status: Home / Self Care | Payer: Self-pay | Source: Ambulatory Visit | Attending: Obstetrics and Gynecology | Admitting: Obstetrics and Gynecology

## 2015-12-06 DIAGNOSIS — Z3A09 9 weeks gestation of pregnancy: Secondary | ICD-10-CM | POA: Insufficient documentation

## 2015-12-06 DIAGNOSIS — O26891 Other specified pregnancy related conditions, first trimester: Secondary | ICD-10-CM | POA: Insufficient documentation

## 2015-12-06 DIAGNOSIS — R109 Unspecified abdominal pain: Secondary | ICD-10-CM | POA: Insufficient documentation

## 2015-12-06 DIAGNOSIS — O039 Complete or unspecified spontaneous abortion without complication: Secondary | ICD-10-CM

## 2015-12-06 LAB — HCG, QUANTITATIVE, PREGNANCY: HCG, BETA CHAIN, QUANT, S: 21 m[IU]/mL — AB (ref <5)

## 2015-12-06 NOTE — Discharge Instructions (Signed)
Aborto espontneo  (Miscarriage)  El aborto espontneo es la prdida de un beb que no ha nacido.(feto) antes de la semana 20 del embarazo. La causa generalmente es desconocida.  CUIDADOS EN EL HOGAR   Debe permanecer en cama (reposo en cama) o podr hacer actividades livianas. Regrese a sus actividades segn las indicaciones del mdico.  Pida ayuda con las tareas domsticas.  Anote cuntos apsitos [place] por da. Describa el grado en que estn empapados.  No use tampones. No se higienice la vagina (duchas vaginales) ni tenga relaciones sexuales (coito) hasta que el mdico la autorice.  Slo debe tomar la medicacin segn las indicaciones del mdico.  No tome aspirina.  Cumpla con los controles mdicos segn las indicaciones.  Si usted o su pareja tienen problemas con el duelo, hable con su mdico. Tambin puede intentar con psicoterapia. Permtase el tiempo suficiente de duelo antes de quedar embarazada nuevamente. SOLICITE AYUDA DE INMEDIATO SI:   Siente clicos intensos o dolor en el estmago, en la espalda o en el vientre (abdomen).  Tiene fiebre.  Elimina grumos de sangre (cogulos) por la vagina, que tienen el tamao de una nuez o ms. Guarde los cogulos para que el [employer] vea.  Elimina gran cantidad de tejidos por la vagina. Guarde lo que ha eliminado para que su mdico lo examine.  Aumenta el sangrado.  Observa una secrecin espesa, con mal olor (prdida) que proviene de la vagina.  Se siente mareada, dbil o se desvanece (se desmaya).  Siente escalofros. ASEGRESE DE QUE:   Comprende estas instrucciones.  Controlar su enfermedad.  Solicitar ayuda de inmediato si no mejora o si empeora.   Esta informacin no tiene Marine scientist el consejo del mdico. Asegrese de hacerle al mdico cualquier pregunta que tenga.   Document Released: 08/30/2011 Elsevier Interactive Patient Education [employer].

## 2015-12-06 NOTE — MAU Provider Note (Signed)
History   Chief Complaint:  Follow-up   Morgan Lawson is  30 y.o. [phone] Patient's last menstrual period was 09/28/2015 (approximate).. Patient is here for follow up of quantitative HCG and ongoing surveillance of pregnancy status.   She is [redacted]w[redacted]d weeks gestation  by best clinical estimate.    Since her last visit, the patient is without new complaint.   The patient reports bleeding as  spotting.  Bleeding is unchanged from last visit.  Still has similar, intermittent abdominal cramping.  Has not used any Tylenol as she was unsure Tylenol was safe in Pregnancy.  General ROS:  No fever, no severe pain, minimal vaginal bleeding, some abdominal cramping.    Last note reviewed and client currently has Nexplanon in place.  Reports she did not use condoms in the two weeks after Nexplanon was placed (at [facility]) and likely that is when this pregnancy occurred.  Advised her the Nexplanon is not the reason for her miscarriage and that her Nexplanon did not fail.  Client was quite tearful at the news that her pregnancy hormone is decreasing.  RN gave her written info on miscarriage and pregnancy loss.  Her previous Quantitative HCG values are: 59 on  12-03-15    Physical Exam   Blood pressure 123/63, pulse 75, temperature 98.9 F (37.2 C), temperature source Oral, resp. rate 16, last menstrual period 09/28/2015.  GENERAL: Well-developed, well-nourished female in no acute distress.  HEENT: Normocephalic, atraumatic.  LUNGS: Effort normal  HEART: Regular rate  SKIN: Warm, dry and without erythema  PSYCH: Normal mood and affect  Labs: Results for orders placed or performed during the hospital encounter of 12/06/15 (from the past 24 hour(s))  hCG, quantitative, pregnancy   Collection Time: 12/06/15  7:16 AM  Result Value Ref Range   hCG, Beta Chain, Quant, S 21 (H) <5 mIU/mL    Ultrasound Studies:   US Ob Comp Less 14 Wks  Result Date: 12/03/2015 CLINICAL DATA:  Vaginal bleeding  in early pregnancy. EXAM: OBSTETRIC <14 WK [place] AND TRANSVAGINAL OB US TECHNIQUE: Both transabdominal and transvaginal ultrasound examinations were performed for complete evaluation of the gestation as well as the maternal uterus, adnexal regions, and pelvic cul-de-sac. Transvaginal technique was performed to assess early pregnancy. COMPARISON:  None. FINDINGS: Intrauterine gestational sac: None Yolk sac:  Not visualize Embryo:  Not visualized Cardiac Activity: Heart Rate:   bpm MSD:   mm    w     d CRL:    mm    w    d                  [place] EDC: Subchorionic hemorrhage:  None visualized. Maternal uterus/adnexae: Endometrium heterogeneous, measuring 14 mm in thickness. No adnexal masses or free fluid. IMPRESSION: No intrauterine pregnancy visualized. Differential considerations would include early intrauterine pregnancy too early to visualize, spontaneous abortion, or occult ectopic pregnancy. Recommend close clinical followup and serial quantitative beta HCGs and ultrasounds. Electronically Signed   By: Rolm Baptise M.D.   On: 12/03/2015 15:36   US Ob Transvaginal  Result Date: 12/03/2015 CLINICAL DATA:  Vaginal bleeding in early pregnancy. EXAM: OBSTETRIC <14 WK [place] AND TRANSVAGINAL OB US TECHNIQUE: Both transabdominal and transvaginal ultrasound examinations were performed for complete evaluation of the gestation as well as the maternal uterus, adnexal regions, and pelvic cul-de-sac. Transvaginal technique was performed to assess early pregnancy. COMPARISON:  None. FINDINGS: Intrauterine gestational sac: None Yolk sac:  Not visualize Embryo:  Not visualized  Cardiac Activity: Heart Rate:   bpm MSD:   mm    w     d CRL:    mm    w    d                  [place] EDC: Subchorionic hemorrhage:  None visualized. Maternal uterus/adnexae: Endometrium heterogeneous, measuring 14 mm in thickness. No adnexal masses or free fluid. IMPRESSION: No intrauterine pregnancy visualized. Differential considerations would include early  intrauterine pregnancy too early to visualize, spontaneous abortion, or occult ectopic pregnancy. Recommend close clinical followup and serial quantitative beta HCGs and ultrasounds. Electronically Signed   By: Rolm Baptise M.D.   On: 12/03/2015 15:36    Assessment:  [redacted]w[redacted]d weeks gestation  With falling quants  Plan: The patient is instructed to follow up on Monday Oct. 2 at 11 am in downstairs clinic for repeat labs - client agrees to come.  Info entered into book of appointments for the clinic. Advised nothing in the vagina - no sex, no tampons, no douching until seen in the clinic. May use OTC Tylenol by the package directions for cramping.    Terri L Burleson 12/06/2015, 8:55 AM

## 2015-12-14 ENCOUNTER — Other Ambulatory Visit: Payer: Self-pay

## 2015-12-14 DIAGNOSIS — O3680X0 Pregnancy with inconclusive fetal viability, not applicable or unspecified: Secondary | ICD-10-CM

## 2015-12-14 LAB — HCG, QUANTITATIVE, PREGNANCY: HCG, BETA CHAIN, QUANT, S: 5 m[IU]/mL — AB (ref <5)

## 2015-12-14 NOTE — Progress Notes (Signed)
Patient presented to office today for repeat quant check. At this time patient does not have any pain. Patient was advised to wait for her results to come back today. Patient verbalizes understanding at this time. Per Provider patient does not need another quant at this time. If she does not have a menses in 6-8 weeks she will need to follow up. Patient verbalizes understanding at this time.

## 2016-02-29 ENCOUNTER — Other Ambulatory Visit: Payer: Self-pay | Admitting: Obstetrics and Gynecology

## 2016-02-29 DIAGNOSIS — D242 Benign neoplasm of left breast: Secondary | ICD-10-CM

## 2016-03-11 ENCOUNTER — Ambulatory Visit
Admission: RE | Admit: 2016-03-11 | Discharge: 2016-03-11 | Disposition: A | Discharge status: Home / Self Care | Payer: No Typology Code available for payment source | Source: Ambulatory Visit | Attending: Obstetrics and Gynecology | Admitting: Obstetrics and Gynecology

## 2016-03-11 DIAGNOSIS — D242 Benign neoplasm of left breast: Secondary | ICD-10-CM

## 2016-10-07 ENCOUNTER — Encounter (HOSPITAL_COMMUNITY): Payer: Self-pay

## 2019-10-11 ENCOUNTER — Other Ambulatory Visit: Payer: Self-pay

## 2019-10-11 DIAGNOSIS — N6320 Unspecified lump in the left breast, unspecified quadrant: Secondary | ICD-10-CM

## 2019-10-31 ENCOUNTER — Other Ambulatory Visit: Payer: Self-pay | Admitting: Obstetrics and Gynecology

## 2019-10-31 ENCOUNTER — Ambulatory Visit: Payer: Self-pay | Admitting: *Deleted

## 2019-10-31 ENCOUNTER — Other Ambulatory Visit: Payer: Self-pay

## 2019-10-31 ENCOUNTER — Ambulatory Visit
Admission: RE | Admit: 2019-10-31 | Discharge: 2019-10-31 | Disposition: A | Discharge status: Home / Self Care | Payer: No Typology Code available for payment source | Source: Ambulatory Visit | Attending: Obstetrics and Gynecology | Admitting: Obstetrics and Gynecology

## 2019-10-31 VITALS — BP 110/74 | Temp 98.4°F | Wt 136.9 lb

## 2019-10-31 DIAGNOSIS — N6320 Unspecified lump in the left breast, unspecified quadrant: Secondary | ICD-10-CM

## 2019-10-31 DIAGNOSIS — Z1239 Encounter for other screening for malignant neoplasm of breast: Secondary | ICD-10-CM

## 2019-10-31 DIAGNOSIS — N6325 Unspecified lump in the left breast, overlapping quadrants: Secondary | ICD-10-CM

## 2019-10-31 NOTE — Progress Notes (Signed)
Ms. Analise Glotfelty is a 34 y.o. female who presents to [facility] clinic today with complaints of a left breast lump.    Pap Smear: Pap smear not completed today. Last Pap smear was 08/20/2019 at [facility]) clinic and was normal with negative HPV. Per patient has no history of an abnormal Pap smear. Last Pap smear result is not available in Epic but records have been obtained from [facility].   Physical exam: Breasts Breasts symmetrical. No skin abnormalities bilateral breasts. No nipple retraction bilateral breasts. No nipple discharge bilateral breasts. No lymphadenopathy. A lump was palpated in the left breast at 12 o'clock 4 cm from the nipple. The lump is BB sized. Patient also complains of bilateral breast tenderness that she attributes to her current pregnancy.       Pelvic/Bimanual Pap is not indicated today.    Smoking History: Patient has never smoked.     Patient Navigation: Patient education provided. Access to services provided for patient through [facility] program. Rudene Anda, [place] interpreter provided through [facility].   Breast and Cervical Cancer Risk Assessment: Patient does not have family history of breast cancer, known genetic mutations, or radiation treatment to the chest before age 61. Patient does not have history of cervical dysplasia, immunocompromised, or DES exposure in-utero.  Risk Assessment    Risk Scores      10/31/2019   Last edited by: Royston Bake, CMA   5-year risk: 0.3 %   Lifetime risk: 10.1 %          A: BCCCP exam without pap smear. Complaints of a left breast lump with tenderness.  P: Referred patient to the [facility] for a breast ultrasound. Appointment scheduled for October 31, 2019 at 3:00pm.   Vania Rea, RN, FNP student  10/31/2019 2:27 PM   Attestation of Supervision of Student:  I confirm that I have verified the information documented in the nurse practitioner student's note and  that I have also personally reperformed the history, physical exam and all medical decision making activities.  I have verified that all services and findings are accurately documented in this student's note; and I agree with management and plan as outlined in the documentation. I have also made any necessary editorial changes.  [place], [place] for [employer], [place] 10/31/2019 3:24 PM

## 2019-10-31 NOTE — Patient Instructions (Addendum)
Informed Morgan Lawson about breast self awareness. Patient did not need a Pap smear today due to last Pap smear and HPV typing was on 08/20/2019. Let her know BCCCP will cover Pap smears and HPV typing every 5 years unless has a history of abnormal Pap smears. Referred patient to the [facility] for a breast ultrasound. Appointment scheduled for October 31, 2019 at 3:00pm. Patient aware of appointment and will be there. Morgan Lawson verbalized understanding.  Vania Rea, RN, FNP student 2:32 PM

## 2019-11-04 ENCOUNTER — Other Ambulatory Visit: Payer: Self-pay | Admitting: Family

## 2019-11-04 DIAGNOSIS — Z3A11 11 weeks gestation of pregnancy: Secondary | ICD-10-CM

## 2019-11-04 DIAGNOSIS — Z369 Encounter for antenatal screening, unspecified: Secondary | ICD-10-CM

## 2019-11-04 DIAGNOSIS — O09521 Supervision of elderly multigravida, first trimester: Secondary | ICD-10-CM

## 2019-11-19 ENCOUNTER — Encounter: Payer: Self-pay | Admitting: *Deleted

## 2019-11-21 ENCOUNTER — Ambulatory Visit: Payer: No Typology Code available for payment source

## 2019-11-21 ENCOUNTER — Other Ambulatory Visit: Payer: Self-pay

## 2020-02-10 ENCOUNTER — Emergency Department (HOSPITAL_COMMUNITY)
Admission: EM | Admit: 2020-02-10 | Discharge: 2020-02-11 | Disposition: A | Discharge status: Home / Self Care | Payer: Self-pay | Attending: Emergency Medicine | Admitting: Emergency Medicine

## 2020-02-10 ENCOUNTER — Other Ambulatory Visit: Payer: Self-pay

## 2020-02-10 ENCOUNTER — Encounter (HOSPITAL_COMMUNITY): Payer: Self-pay | Admitting: Emergency Medicine

## 2020-02-10 DIAGNOSIS — Z23 Encounter for immunization: Secondary | ICD-10-CM | POA: Insufficient documentation

## 2020-02-10 DIAGNOSIS — S61511A Laceration without foreign body of right wrist, initial encounter: Secondary | ICD-10-CM | POA: Insufficient documentation

## 2020-02-10 DIAGNOSIS — Y93G1 Activity, food preparation and clean up: Secondary | ICD-10-CM | POA: Insufficient documentation

## 2020-02-10 DIAGNOSIS — X152XXA Contact with hotplate, initial encounter: Secondary | ICD-10-CM | POA: Insufficient documentation

## 2020-02-10 NOTE — ED Triage Notes (Signed)
Spanish speaker pt c/o laceration on her right wrist that happened while she was cleaning dishes.

## 2020-02-11 ENCOUNTER — Encounter: Payer: Self-pay | Admitting: *Deleted

## 2020-02-11 ENCOUNTER — Emergency Department (HOSPITAL_COMMUNITY): Payer: Self-pay

## 2020-02-11 MED ORDER — NAPROXEN 500 MG PO TABS: 500.0000 mg | ORAL_TABLET | Freq: Two times a day (BID) | ORAL | 0 refills | Status: DC

## 2020-02-11 MED ORDER — NAPROXEN 250 MG PO TABS
500.0000 mg | ORAL_TABLET | Freq: Once | ORAL | Status: AC
  Administered 2020-02-11: 500 mg via ORAL
  Filled 2020-02-11: qty 2

## 2020-02-11 MED ORDER — TETANUS-DIPHTH-ACELL PERTUSSIS 5-2.5-18.5 LF-MCG/0.5 IM SUSY
0.5000 mL | PREFILLED_SYRINGE | Freq: Once | INTRAMUSCULAR | Status: AC
  Administered 2020-02-11: 0.5 mL via INTRAMUSCULAR
  Filled 2020-02-11: qty 0.5

## 2020-02-11 NOTE — ED Provider Notes (Signed)
[facility] EMERGENCY DEPARTMENT Provider Note   CSN: [id number] Arrival date & time: 02/10/20  1353     History Chief Complaint  Patient presents with  . Laceration    Morgan Lawson is a 34 y.o. female.  34 year old female presents to the emergency department for evaluation of laceration to the right wrist.  She is right-hand dominant.  Reports that she was washing dishes when a plate broke in half and cut her.  She has been experiencing pain at the site of laceration which has been constant and unchanged.  No medications taken prior to arrival.  Reports some paresthesias around the wound site without numbness.  No restricted movement of digits.  She has not taken any medications for pain.  Cannot recall the date of her last tetanus shot.  Incident occurred at noon yesterday, 02/10/20.       History reviewed. No pertinent past medical history.  There are no problems to display for this patient.   History reviewed. No pertinent surgical history.   OB History   No obstetric history on file.     No family history on file.  Social History   Tobacco Use  . Smoking status: Never Smoker  . Smokeless tobacco: Never Used  Substance Use Topics  . Alcohol use: Never  . Drug use: Never    Home Medications Prior to Admission medications   Medication Sig Start Date End Date Taking? Authorizing Provider  naproxen (NAPROSYN) 500 MG tablet Take 1 tablet (500 mg total) by mouth 2 (two) times daily. 02/11/20   Antonietta Breach, PA-C    Allergies    Patient has no known allergies.  Review of Systems   Review of Systems  Ten systems reviewed and are negative for acute change, except as noted in the HPI.    Physical Exam Updated Vital Signs BP (!) 100/58 (BP Location: Left Arm)   Pulse 77   Temp 98.6 F (37 C) (Oral)   Resp 16   Ht 5' (1.524 m)   Wt 68 kg   SpO2 100%   BMI 29.28 kg/m   Physical Exam Vitals and nursing note reviewed.    Constitutional:      General: She is not in acute distress.    Appearance: She is well-developed. She is not diaphoretic.     Comments: Nontoxic appearing and in NAD  HENT:     Head: Normocephalic and atraumatic.  Eyes:     General: No scleral icterus.    Conjunctiva/sclera: Conjunctivae normal.  Cardiovascular:     Rate and Rhythm: Normal rate and regular rhythm.     Pulses: Normal pulses.     Comments: Distal radial pulse 2+ in the RUE. Capillary refill brisk in all digits of the R hand. Pulmonary:     Effort: Pulmonary effort is normal. No respiratory distress.     Comments: Respirations even and unlabored Musculoskeletal:        General: Normal range of motion.     Cervical back: Normal range of motion.     Comments: TTP at site of laceration. No edema, drainage, erythema. No bony deformity or crepitus.  Skin:    General: Skin is warm and dry.     Coloration: Skin is not pale.     Findings: No erythema or rash.     Comments: 2cm curved laceration along the radial aspect of the wrist. No active bleeding.  Neurological:     Mental Status: She is alert and  oriented to person, place, and time.  Psychiatric:        Behavior: Behavior normal.     ED Results / Procedures / Treatments   Labs (all labs ordered are listed, but only abnormal results are displayed) Labs Reviewed - No data to display  EKG None  Radiology DG Wrist Complete Right  Result Date: 02/11/2020 CLINICAL DATA:  Laceration. EXAM: RIGHT WRIST - COMPLETE 3+ VIEW COMPARISON:  No prior. FINDINGS: Laceration noted over the right wrist. No evidence of fracture or dislocation. No radiopaque foreign body. IMPRESSION: Laceration noted over the right wrist. No radiopaque foreign body. No acute bony abnormality. Electronically Signed   By: Marcello Moores  Register   On: 02/11/2020 06:06    Procedures Procedures (including critical care time)  Medications Ordered in ED Medications  naproxen (NAPROSYN) tablet 500 mg  (500 mg Oral Given 02/11/20 0604)  Tdap (BOOSTRIX) injection 0.5 mL (0.5 mLs Intramuscular Given 02/11/20 7494)    ED Course  I have reviewed the triage vital signs and the nursing notes.  Pertinent labs & imaging results that were available during my care of the patient were reviewed by me and considered in my medical decision making (see chart for details).    MDM Rules/Calculators/A&P                          Tdap booster given.  Patient neurovascularly intact.  Wound was cleaned and dressed.  Unfortunately, incident occurred 18 hours ago and patient is outside the window for recommended suture repair.  Discussed home care with patient and answered questions.  Patient to follow up for wound check PRN.  Patient is hemodynamically stable with no complaints prior to discharge.     Final Clinical Impression(s) / ED Diagnoses Final diagnoses:  Laceration of right wrist, initial encounter    Rx / DC Orders ED Discharge Orders         Ordered    naproxen (NAPROSYN) 500 MG tablet  2 times daily        02/11/20 0633           Antonietta Breach, PA-C 02/11/20 4967    Horton, Barbette Hair, MD 02/11/20 [phone]

## 2020-02-11 NOTE — Discharge Instructions (Addendum)
Avoid soaking your wound in stagnant or dirty water such as while taking a bath. You can shower normally. Keep the area clean with mild soap and warm water. Do not apply peroxide or alcohol to your wound as this can break down newly forming skin and prolong wound healing. If you keep the area bandaged, change the dressing/bandage at least once per day. Take Naproxen as prescribed for pain. -- Psychologist, educational su herida en agua estancada o sucia, como cuando se baa. Puedes ducharte normalmente. Mantenga el rea limpia con un jabn suave y agua tibia. No aplique perxido o alcohol a la herida, ya que esto puede romper la piel recin formada y Teaching laboratory technician la cicatrizacin de la herida. Si mantiene el rea vendada, cambie el apsito / vendaje al menos una vez al da. Tome naproxeno segn lo prescrito para Conservation officer, historic buildings.

## 2020-03-14 NOTE — L&D Delivery Note (Signed)
Delivery Note Called to bedside in MAU and patient complete and pushing. At 9:32 PM a viable female was delivered via Vaginal, Spontaneous (Presentation: Left Occiput Anterior).  APGAR: 9, 9; weight pending.   Placenta status: Spontaneous, Intact.  Cord: 3 vessels with the following complications: None.  Anesthesia: None Episiotomy: None Lacerations: Periurethral bilateral, hemostatic. Suture Repair: n/a Est. Blood Loss (mL): 50  Mom to postpartum.  Baby to Couplet care / Skin to Skin.  Arrie Senate 10/20/3404, 10:37 PM

## 2020-05-07 ENCOUNTER — Other Ambulatory Visit: Payer: Self-pay

## 2020-05-07 ENCOUNTER — Ambulatory Visit
Admission: RE | Admit: 2020-05-07 | Discharge: 2020-05-07 | Disposition: A | Discharge status: Home / Self Care | Payer: No Typology Code available for payment source | Source: Ambulatory Visit | Attending: Obstetrics and Gynecology | Admitting: Obstetrics and Gynecology

## 2020-05-07 DIAGNOSIS — N6320 Unspecified lump in the left breast, unspecified quadrant: Secondary | ICD-10-CM

## 2020-05-19 ENCOUNTER — Inpatient Hospital Stay (HOSPITAL_COMMUNITY)
Admission: AD | Admit: 2020-05-19 | Discharge: 2020-05-21 | DRG: 807 | Disposition: A | Discharge status: Home / Self Care | Payer: Medicaid Other | Attending: Obstetrics and Gynecology | Admitting: Obstetrics and Gynecology

## 2020-05-19 ENCOUNTER — Encounter (HOSPITAL_COMMUNITY): Payer: Self-pay | Admitting: Obstetrics and Gynecology

## 2020-05-19 DIAGNOSIS — Z20822 Contact with and (suspected) exposure to covid-19: Secondary | ICD-10-CM | POA: Diagnosis present

## 2020-05-19 DIAGNOSIS — O99355 Diseases of the nervous system complicating the puerperium: Secondary | ICD-10-CM | POA: Diagnosis not present

## 2020-05-19 DIAGNOSIS — O99824 Streptococcus B carrier state complicating childbirth: Secondary | ICD-10-CM | POA: Diagnosis present

## 2020-05-19 DIAGNOSIS — O99820 Streptococcus B carrier state complicating pregnancy: Secondary | ICD-10-CM

## 2020-05-19 DIAGNOSIS — Z3A39 39 weeks gestation of pregnancy: Secondary | ICD-10-CM

## 2020-05-19 DIAGNOSIS — B951 Streptococcus, group B, as the cause of diseases classified elsewhere: Secondary | ICD-10-CM

## 2020-05-19 DIAGNOSIS — O26893 Other specified pregnancy related conditions, third trimester: Secondary | ICD-10-CM | POA: Diagnosis present

## 2020-05-19 DIAGNOSIS — G8918 Other acute postprocedural pain: Secondary | ICD-10-CM | POA: Diagnosis not present

## 2020-05-19 LAB — TYPE AND SCREEN
ABO/RH(D): O POS
Antibody Screen: NEGATIVE

## 2020-05-19 LAB — CBC
HCT: 40 % (ref 36.0–46.0)
Hemoglobin: 14 g/dL (ref 12.0–15.0)
MCH: 30.2 pg (ref 26.0–34.0)
MCHC: 35 g/dL (ref 30.0–36.0)
MCV: 86.2 fL (ref 80.0–100.0)
Platelets: 247 10*3/uL (ref 150–400)
RBC: 4.64 MIL/uL (ref 3.87–5.11)
RDW: 14.5 % (ref 11.5–15.5)
WBC: 11.8 10*3/uL — ABNORMAL HIGH (ref 4.0–10.5)
nRBC: 0 % (ref 0.0–0.2)

## 2020-05-19 MED ORDER — SIMETHICONE 80 MG PO CHEW
80.0000 mg | CHEWABLE_TABLET | ORAL | Status: DC | PRN
Start: 2020-05-19 — End: 2020-05-21

## 2020-05-19 MED ORDER — TETANUS-DIPHTH-ACELL PERTUSSIS 5-2.5-18.5 LF-MCG/0.5 IM SUSY: 0.5000 mL | PREFILLED_SYRINGE | Freq: Once | INTRAMUSCULAR | Status: DC

## 2020-05-19 MED ORDER — ACETAMINOPHEN 325 MG PO TABS
650.0000 mg | ORAL_TABLET | ORAL | Status: DC | PRN
  Administered 2020-05-20 – 2020-05-21 (×2): 650 mg via ORAL

## 2020-05-19 MED ORDER — ONDANSETRON HCL 4 MG/2ML IJ SOLN: 4.0000 mg | INTRAMUSCULAR | Status: DC | PRN

## 2020-05-19 MED ORDER — MEASLES, MUMPS & RUBELLA VAC IJ SOLR: 0.5000 mL | Freq: Once | INTRAMUSCULAR | Status: DC

## 2020-05-19 MED ORDER — COCONUT OIL OIL: 1.0000 "application " | TOPICAL_OIL | Status: DC | PRN

## 2020-05-19 MED ORDER — PRENATAL MULTIVITAMIN CH
1.0000 | ORAL_TABLET | Freq: Every day | ORAL | Status: DC
  Administered 2020-05-20 – 2020-05-21 (×2): 1 via ORAL
  Filled 2020-05-19 (×2): qty 1

## 2020-05-19 MED ORDER — OXYCODONE-ACETAMINOPHEN 5-325 MG PO TABS: 2.0000 | ORAL_TABLET | ORAL | Status: DC | PRN

## 2020-05-19 MED ORDER — SENNOSIDES-DOCUSATE SODIUM 8.6-50 MG PO TABS
2.0000 | ORAL_TABLET | ORAL | Status: DC
  Administered 2020-05-20: 2 via ORAL
  Filled 2020-05-19: qty 2

## 2020-05-19 MED ORDER — BENZOCAINE-MENTHOL 20-0.5 % EX AERO
1.0000 "application " | INHALATION_SPRAY | CUTANEOUS | Status: DC | PRN
  Administered 2020-05-20: 1 via TOPICAL
  Filled 2020-05-19: qty 56

## 2020-05-19 MED ORDER — LACTATED RINGERS IV SOLN: INTRAVENOUS | Status: DC

## 2020-05-19 MED ORDER — OXYTOCIN 10 UNIT/ML IJ SOLN
10.0000 [IU] | Freq: Once | INTRAMUSCULAR | Status: AC
  Administered 2020-05-19: 10 [IU] via INTRAMUSCULAR

## 2020-05-19 MED ORDER — OXYCODONE-ACETAMINOPHEN 5-325 MG PO TABS: 1.0000 | ORAL_TABLET | ORAL | Status: DC | PRN

## 2020-05-19 MED ORDER — SOD CITRATE-CITRIC ACID 500-334 MG/5ML PO SOLN: 30.0000 mL | ORAL | Status: DC | PRN

## 2020-05-19 MED ORDER — FENTANYL CITRATE (PF) 100 MCG/2ML IJ SOLN
50.0000 ug | INTRAMUSCULAR | Status: DC | PRN
Start: 2020-05-19 — End: 2020-05-21

## 2020-05-19 MED ORDER — ACETAMINOPHEN 325 MG PO TABS
650.0000 mg | ORAL_TABLET | ORAL | Status: DC | PRN
  Filled 2020-05-19 (×2): qty 2

## 2020-05-19 MED ORDER — LACTATED RINGERS IV SOLN: 500.0000 mL | INTRAVENOUS | Status: DC | PRN

## 2020-05-19 MED ORDER — IBUPROFEN 600 MG PO TABS
600.0000 mg | ORAL_TABLET | Freq: Four times a day (QID) | ORAL | Status: DC
  Administered 2020-05-20 – 2020-05-21 (×7): 600 mg via ORAL
  Filled 2020-05-19 (×7): qty 1

## 2020-05-19 MED ORDER — LIDOCAINE HCL (PF) 1 % IJ SOLN
30.0000 mL | INTRAMUSCULAR | Status: DC | PRN
  Filled 2020-05-19: qty 30

## 2020-05-19 MED ORDER — ERYTHROMYCIN 5 MG/GM OP OINT
TOPICAL_OINTMENT | OPHTHALMIC | Status: AC
  Filled 2020-05-19: qty 1

## 2020-05-19 MED ORDER — LIDOCAINE HCL (PF) 1 % IJ SOLN
INTRAMUSCULAR | Status: AC
  Filled 2020-05-19: qty 5

## 2020-05-19 MED ORDER — DIPHENHYDRAMINE HCL 25 MG PO CAPS: 25.0000 mg | ORAL_CAPSULE | Freq: Four times a day (QID) | ORAL | Status: DC | PRN

## 2020-05-19 MED ORDER — OXYTOCIN-SODIUM CHLORIDE 30-0.9 UT/500ML-% IV SOLN: 2.5000 [IU]/h | INTRAVENOUS | Status: DC

## 2020-05-19 MED ORDER — LIDOCAINE HCL (PF) 1 % IJ SOLN
INTRAMUSCULAR | Status: AC
  Filled 2020-05-19: qty 10

## 2020-05-19 MED ORDER — TRANEXAMIC ACID-NACL 1000-0.7 MG/100ML-% IV SOLN
1000.0000 mg | Freq: Once | INTRAVENOUS | Status: AC
  Administered 2020-05-19: 1000 mg via INTRAVENOUS

## 2020-05-19 MED ORDER — DIBUCAINE (PERIANAL) 1 % EX OINT
1.0000 | TOPICAL_OINTMENT | CUTANEOUS | Status: DC | PRN
Start: 2020-05-19 — End: 2020-05-21

## 2020-05-19 MED ORDER — WITCH HAZEL-GLYCERIN EX PADS: 1.0000 "application " | MEDICATED_PAD | CUTANEOUS | Status: DC | PRN

## 2020-05-19 MED ORDER — OXYTOCIN BOLUS FROM INFUSION
333.0000 mL | Freq: Once | INTRAVENOUS | Status: AC
  Administered 2020-05-19: 333 mL via INTRAVENOUS

## 2020-05-19 MED ORDER — ONDANSETRON HCL 4 MG PO TABS: 4.0000 mg | ORAL_TABLET | ORAL | Status: DC | PRN

## 2020-05-19 MED ORDER — ONDANSETRON HCL 4 MG/2ML IJ SOLN: 4.0000 mg | Freq: Four times a day (QID) | INTRAMUSCULAR | Status: DC | PRN

## 2020-05-19 NOTE — Progress Notes (Signed)
Called back to bedside by MAU RN for continued bleeding. Approx 123mL blood/clots noted in bed. IV pitocin started as well as IV txa. Fundus noted to be firm, midline and below umbilicus. Bilateral periurethral lacerations that were previously hemostatic noted to be oozing. Repair performed with 3.0 vicryl rapide and local lidocaine 74mL. Patient tolerated procedure well and hemostasis was noted. In person interpreter used for entirety of procedure.  Arrie Senate, MD OB Fellow, [place] for [place] 05/19/2020 10:39 PM

## 2020-05-19 NOTE — H&P (Signed)
OBSTETRIC ADMISSION HISTORY AND PHYSICAL  Morgan Lawson is a 35 y.o. female [phone] with IUP at [redacted]w[redacted]d by LMP presenting for SOL. She had a precipitous delivery int he MAU. She reports +FMs, No LOF, no VB, no blurry vision, headaches or peripheral edema, and RUQ pain.  She plans on breast feeding. She is undecided for birth control. She received her prenatal care at [facility]: By LMP --->  Estimated Date of Delivery: 05/24/20  Sono:    Not on file   Prenatal History/Complications:  GBS + urine Language barrier (spanish speaking)  Past Medical History: Past Medical History:  Diagnosis Date  . Medical history non-contributory     Past Surgical History: Past Surgical History:  Procedure Laterality Date  . NO PAST SURGERIES      Obstetrical History: OB History    Gravida  5   Para  2   Term  2   Preterm  0   AB  2   Living  2     SAB  2   IAB  0   Ectopic  0   Multiple      Live Births  2           Social History Social History   Socioeconomic History  . Marital status: Married    Spouse name: Not on file  . Number of children: Not on file  . Years of education: Not on file  . Highest education level: 9th grade  Occupational History  . Not on file  Tobacco Use  . Smoking status: Never Smoker  . Smokeless tobacco: Never Used  Vaping Use  . Vaping Use: Never used  Substance and Sexual Activity  . Alcohol use: Never  . Drug use: Never  . Sexual activity: Yes    Birth control/protection: None  Other Topics Concern  . Not on file  Social History Narrative   ** Merged History Encounter **       Social Determinants of Health   Financial Resource Strain: Not on file  Food Insecurity: Not on file  Transportation Needs: No Transportation Needs  . Lack of Transportation (Medical): No  . Lack of Transportation (Non-Medical): No  Physical Activity: Not on file  Stress: Not on file  Social Connections: Not on file     Family History: Family History  Problem Relation Age of Onset  . Seizures Sister   . Diabetes Paternal Grandfather     Allergies: No Known Allergies  Medications Prior to Admission  Medication Sig Dispense Refill Last Dose  . etonogestrel (IMPLANON) 68 MG IMPL implant 1 each by Subdermal route once. (Patient not taking: Reported on 10/31/2019)     . naproxen (NAPROSYN) 500 MG tablet Take 1 tablet (500 mg total) by mouth 2 (two) times daily. 30 tablet 0   . Prenatal Vit-Fe Fumarate-FA (MULTIVITAMIN-PRENATAL) 27-0.8 MG TABS tablet Take 1 tablet by mouth daily at 12 noon.        Review of Systems   All systems reviewed and negative except as stated in HPI  Blood pressure 117/66, pulse 69, last menstrual period 08/18/2019, unknown if currently breastfeeding. General appearance: alert, cooperative and moderate distress Lungs: normal respiratory effort Heart: regular rate and rhythm Abdomen: soft, non-tender; gravid Pelvic: 10/100/+2 Extremities: Homans sign is negative, no sign of DVT Presentation: cephalic by cervical exam Fetal monitoringBaseline: 110 bpm, Variability: Good {> 6 bpm), Accelerations: Reactive and Decelerations: Absent Uterine activityFrequency: Every 1-3 minutes  Prenatal labs: ABO, Rh:  O pos Antibody:  neg Rubella:  Immune RPR:   NR HBsAg:   NR HIV:   NR GBS:   positive urine 1 hr Glucola failed, passed 3hr gtt Genetic screening  normal Anatomy US normal  Prenatal Transfer Tool  Maternal Diabetes: No Genetic Screening: Normal Maternal Ultrasounds/Referrals: Normal Fetal Ultrasounds or other Referrals:  None Maternal Substance Abuse:  No Significant Maternal Medications:  None Significant Maternal Lab Results: Group B Strep positive  No results found for this or any previous visit (from the past 24 hour(s)).  Patient Active Problem List   Diagnosis Date Noted  . Normal labor 05/19/2020  . Vaginal delivery 05/19/2020     Assessment/Plan:  Morgan Lawson is a 35 y.o. [id number] at [redacted]w[redacted]d here for SOL.  #Labor: Patient presents 10/100/+2 and actively pushing, delivered uncomplicated in MAU. Please refer to delivery summary for further details. #Pain: none #FWB: Cat 1 #ID: GBS+, no prophylaxis given precipitous delivery #MOF: breast #MOC: undecided #Circ: undecided  Arrie Senate, MD  05/19/2020, 10:13 PM

## 2020-05-19 NOTE — MAU Note (Signed)
Pt arrived to MAU in active labor. Provider called to bedside.

## 2020-05-20 ENCOUNTER — Encounter (HOSPITAL_COMMUNITY): Payer: Self-pay | Admitting: Obstetrics and Gynecology

## 2020-05-20 DIAGNOSIS — G8918 Other acute postprocedural pain: Secondary | ICD-10-CM

## 2020-05-20 DIAGNOSIS — B951 Streptococcus, group B, as the cause of diseases classified elsewhere: Secondary | ICD-10-CM

## 2020-05-20 DIAGNOSIS — O99355 Diseases of the nervous system complicating the puerperium: Secondary | ICD-10-CM

## 2020-05-20 LAB — RPR: RPR Ser Ql: NONREACTIVE

## 2020-05-20 LAB — RESP PANEL BY RT-PCR (FLU A&B, COVID) ARPGX2
Influenza A by PCR: NEGATIVE
Influenza B by PCR: NEGATIVE
SARS Coronavirus 2 by RT PCR: NEGATIVE

## 2020-05-20 MED ORDER — MEDROXYPROGESTERONE ACETATE 150 MG/ML IM SUSP: 150.0000 mg | Freq: Once | INTRAMUSCULAR | Status: DC

## 2020-05-20 NOTE — Lactation Note (Signed)
This note was copied from a baby's chart. Lactation Consultation Note Baby 7 hrs old. Delivered in MAU. RN Lorriane Shire interpret Spanish for mom and LC.  Mom has 65 and 36 yr old that she BF for 54yr and 1 1/2 yr. Denies difficulty.  Baby latched w/ several attempts.  Body alignment, positioning, support, safety and comfort while feeding reviewed. Mom encouraged to feed baby 8-12 times/24 hours and with feeding cues.  Newborn feeding habits, STS, I&O, breast massage, supply and demand discussed. Baby BF well no swallows heard. Breast felt some what softer. Mom happy about that.  Encouraged mom to call for assistance or questions.  Patient Name: Morgan Lawson [id number] Date: 05/20/2020 Reason for consult: Initial assessment;Term Age:13 hours  Maternal Data Has patient been taught Hand Expression?: Yes Does the patient have breastfeeding experience prior to this delivery?: Yes How long did the patient breastfeed?: 1 yr each 39yr and 36 yr old  Feeding    LATCH Score Latch: Grasps breast easily, tongue down, lips flanged, rhythmical sucking.  Audible Swallowing: None  Type of Nipple: Everted at rest and after stimulation  Comfort (Breast/Nipple): Soft / non-tender  Hold (Positioning): Assistance needed to correctly position infant at breast and maintain latch.  LATCH Score: 7   Lactation Tools Discussed/Used    Interventions Interventions: Breast feeding basics reviewed;Adjust position;Assisted with latch;Support pillows;Skin to skin;Position options;Breast massage;Hand express;Breast compression  Discharge WIC Program: Yes  Consult Status Consult Status: Follow-up Date: 05/20/20 (in pm) Follow-up type: In-patient    Theodoro Kalata 05/20/2020, 4:37 AM

## 2020-05-20 NOTE — Discharge Instructions (Signed)
Parto vaginal, cuidados de puerperio Postpartum Care After Vaginal Delivery La siguiente informacin ofrece una gua sobre cmo cuidarse desde el momento en que nazca su beb y hasta 6 a 12 semanas despus del parto (perodo del posparto). Si tiene problemas o preguntas, pngase en contacto con su mdico para obtener instrucciones ms especficas. Siga estas instrucciones en su casa: Hemorragia vaginal  Es normal tener un poco de hemorragia vaginal (loquios) despus del parto. Use un apsito sanitario para el sangrado y secrecin. ? Durante la primera semana despus del parto, la cantidad y el aspecto de los loquios a menudo es similar a los del perodo menstrual. ? Durante las siguientes semanas disminuir gradualmente hasta convertirse en una secrecin seca amarronada o amarillenta. ? En la mayora de las mujeres, los loquios se detienen completamente entre 4 a 6semanas despus del parto, pero esto puede variar.  Cambie los apsitos sanitarios con frecuencia. Observe si hay cambios en el flujo, como: ? Aumento repentino en el volumen. ? Cambio en el color. ? Cogulos de sangre grandes.  Si expulsa un cogulo de sangre por la vagina, gurdelo y llame al mdico. No deseche los cogulos de sangre por el inodoro antes de hablar con su mdico.  No use tampones ni se haga duchas vaginales hasta que el mdico la autorice.  Si no est amamantando, volver a tener su perodo entre 6 y 8 semanas despus del parto. Si solamente alimenta al beb con leche materna, podra no volver a tener su perodo hasta que deje de amamantar. Cuidados perineales  Mantenga la zona entre la vagina y el ano (perineo) limpia y seca. Utilice apsitos o aerosoles analgsicos y cremas, como se lo hayan indicado.  Si le hicieron un corte quirrgico en el perineo (episiotoma) o tuvo un desgarro, controle la zona para detectar signos de infeccin hasta que sane. Est atenta a los siguientes signos: ? Aumento del  enrojecimiento, la hinchazn o el dolor. ? Presenta lquido o sangre que supura del corte o desgarro. ? Calor. ? Pus o mal olor.  Es posible que le den una botella rociadora para que use en lugar de limpiarse el rea con papel higinico despus de usar el bao. Seque la zona dando golpecitos suaves.  Para aliviar el dolor causado por una episiotoma, un desgarro o venas hinchadas en el ano (hemorroides), tome un bao de asiento tibio 2 o 3 veces por da. En un bao de asiento, el agua tibia solamente debe llegar hasta las caderas y cubrir las nalgas.   Cuidado de las mamas  En los primeros das despus del parto, las mamas pueden sentirse pesadas, llenas e incmodas (congestin mamaria). Tambin puede escaparse leche de sus senos. Pdale al mdico que le sugiera formas de aliviar el malestar.  Si est amamantando: ? Use un sostn que sujete las mamas y ajuste bien. Utilice protectores mamarios para absorber la leche que se filtre. ? Mantenga los pezones secos y limpios. Aplique cremas y ungentos como se lo hayan indicado. ? Puede tener contracciones uterinas cada vez que amamante durante varias semanas despus del parto. Esto ayuda a que el tero vuelva a su tamao normal. ? Si tiene algn problema con la lactancia materna, infrmelo al mdico o a un asesor en lactancia.  Si no est amamantando: ? Evite tocarse las mamas. No extraiga (saque) leche materna. Al hacerlo, podran producir ms leche. ? Use un sostn que le proporcione el ajuste correcto y compresas fras para reducir la hinchazn. Intimidad y sexualidad    Pregntele al mdico cundo puede retomar la actividad sexual. Esto puede depender de lo siguiente: ? Su riesgo de sufrir infecciones. ? La rapidez con la que est sanando. ? Su comodidad y deseo de retomar la actividad sexual.  Despus del parto, puede quedar embarazada incluso si no ha tenido todava su perodo. Hable con el mdico acerca de los mtodos de control de la  natalidad (mtodos anticonceptivos) o planificacin familiar si desea tener embarazos en el futuro. Medicamentos  Use los medicamentos de venta libre y los recetados solamente como se lo haya indicado el mdico.  Tome un laxante de venta libre para ayudar con las deposiciones como se lo haya indicado el mdico.  Si le recetaron un antibitico, tmelo como se lo haya indicado el mdico. No deje de tomar el antibitico aunque comience a sentirse mejor.  Revise todos los medicamentos con receta anteriores y actuales para comprobar la posible transferencia a la leche materna. Actividad  Retome sus actividades normales de a poco segn lo indicado por el mdico.  Descanse todo lo que pueda. Tome siestas mientras el beb duerme. Comida y bebida  Beba suficiente lquido como para mantener la orina de color amarillo plido.  Para ayudar a prevenir o aliviar el estreimiento, coma alimentos ricos en fibra todos los das.  Elija una alimentacin saludable para ayudar a la lactancia o los objetivos de prdida de peso.  Tome sus vitaminas prenatales hasta que su mdico le indique que deje de hacerlo.   Recomendaciones/consejos generales  No consuma ningn producto que contenga nicotina o tabaco. Estos productos incluyen cigarrillos, tabaco para mascar y aparatos de vapeo, como los cigarrillos electrnicos. Si necesita ayuda para dejar de fumar, consulte al mdico.  No beba alcohol, especialmente si est amamantando.  No tome medicamentos o frmacos que no se le receten, especialmente si est en perodo de lactancia.  Visite al mdico para un control de posparto dentro de las primeras 3 a 6 semanas despus del parto.  Realice una visita posparto integral a ms tardar 12 semanas despus del parto.  Asista a todas las visitas de seguimiento para usted y su beb. Comunquese con un mdico si:  Siente tristeza o preocupacin de forma inusual.  Las mamas se ponen rojas, le duelen o se  endurecen.  Tiene fiebre u otros signos de infeccin.  Tiene sangrado que est empapando una compresa por hora o tiene cogulos de sangre.  Siente un dolor de cabeza intenso que no se alivia o tiene cambios en la visin.  Tiene nuseas y vmitos y no puede comer o beber nada durante 24horas. Solicite ayuda de inmediato si:  Tiene dolor en el pecho o dificultad para respirar.  Tiene un dolor repentino e intenso en la pierna.  Tiene una convulsin o se desmaya.  Tiene pensamientos acerca de lastimarse a usted misma o a su beb. Si alguna vez siente que puede lastimarse o lastimar a otras personas, o tiene pensamientos de poner fin a su vida, busque ayuda de inmediato. Dirjase al servicio de urgencias ms cercano o:  Comunquese con el servicio de emergencias de su localidad (911 en los [place]).  National Suicide Prevention Lifeline (Lnea Telefnica Nacional para la Prevencin del Suicidio) al 1-800-273-8255. Esta lnea de asistencia al suicida est abierta las 24 horas del da.  Enve un mensaje de texto a la lnea para casos de crisis al [id number] (en los [place] Resumen  El perodo de tiempo despus el parto y hasta 6 a 12 semanas despus   del parto se denomina perodo posparto.  Asista a todas las visitas de seguimiento para usted y su beb.  Revise todos los medicamentos con receta anteriores y actuales para comprobar la posible transferencia a la leche materna.  Pngase en contacto con un mdico si se siente inusualmente triste o preocupada durante el perodo posparto. Esta informacin no tiene como fin reemplazar el consejo del mdico. Asegrese de hacerle al mdico cualquier pregunta que tenga. Document Revised: 01/02/2020 Document Reviewed: 12/10/2019 Elsevier Patient Education  2021 [employer]vier [employer].  

## 2020-05-20 NOTE — Lactation Note (Addendum)
This note was copied from a baby's chart. Lactation Consultation Note  Patient Name: Morgan Lawson NOTRR'N Date: 05/20/2020 Reason for consult: Follow-up assessment;Term Age:36 hours  Consult was done in Spanish:  Follow up visit to 18 hours infant with 4.93% weight loss at the time of consult. Mother states infant breastfeed for >15 minutes ~2h ago.   Mother is holding infant upon arrival. Infant is showing hunger cues and LC offered assistance to latch infant. Undressed infant and placed infant for cradle latch to right breast. Hand expressed some colostrum. Latched infant but no suckling or swallowing. Infant holds nipple in mouth and seems uninterested at the moment.    Plan:   1-Breastfeeding on demand, ensuring a deep, comfortable latch.  2-Undressing infant and place skin to skin when ready to breastfeed 3-Keep infant awake during breastfeeding session: massaging breast, infant's hand/shoulder/feet 4-Monitor voids and stools as signs good intake.  5-Contact LC as needed for feeds/support/concerns/questions   Maternal Data Has patient been taught Hand Expression?: Yes Does the patient have breastfeeding experience prior to this delivery?: Yes How long did the patient breastfeed?: 105months and 12+months  Feeding Mother's Current Feeding Choice: Breast Milk  LATCH Score Latch: Repeated attempts needed to sustain latch, nipple held in mouth throughout feeding, stimulation needed to elicit sucking reflex.  Audible Swallowing: None  Type of Nipple: Everted at rest and after stimulation  Comfort (Breast/Nipple): Soft / non-tender  Hold (Positioning): No assistance needed to correctly position infant at breast.  LATCH Score: 7   Lactation Tools Discussed/Used    Interventions Interventions: Breast feeding basics reviewed;Assisted with latch;Skin to skin;Breast massage;Hand express;Adjust position;Expressed milk  Discharge WIC Program: Yes  Consult  Status Consult Status: Follow-up Date: 05/21/20 Follow-up type: In-patient    Rhodes Calvert A Higuera Ancidey 05/20/2020, 4:02 PM

## 2020-05-20 NOTE — Progress Notes (Addendum)
POSTPARTUM PROGRESS NOTE  Subjective: Morgan Lawson is a 35 y.o. [id number] PPD#1 precipitous NSVB in the MAU at [redacted]w[redacted]d. She reports she is doing well. No acute events overnight. She denies any problems with ambulating, voiding or po intake. Denies nausea or vomiting. She has passed flatus. Pain is moderately controlled. Lochia is appropriate.  Objective: Blood pressure 107/76, pulse 77, temperature 98 F (36.7 C), temperature source Axillary, resp. rate 18, weight 74.4 kg, last menstrual period 08/18/2019, unknown if currently breastfeeding.  Physical Exam:  General: alert, cooperative and no distress Chest: no respiratory distress Abdomen: soft, non-tender  Uterine Fundus: firm and at level of umbilicus Extremities: No calf swelling or tenderness  no edema  Recent Labs    05/19/20 2150  HGB 14.0  HCT 40.0    Assessment/Plan: Morgan Lawson is a 35 y.o. [id number] PPD#1 precipitous NSVB at [redacted]w[redacted]d for postpartum care.  Routine Postpartum Care: Doing well, pain moderately controlled.  -- Continue routine care, lactation support  -- Contraception: Depo PP, ordered -- Feeding: Breast -- Pain: PRN  Dispo: Plan for discharge on PPD#2.  Rennis Chris, Medical Student 05/20/2020 8:00 AM  GME ATTESTATION:  I saw and evaluated the patient. I agree with the findings and the plan of care as documented in the resident's note.  Arrie Senate, MD OB Fellow, [place] [facility] [place] 05/20/2020 8:20 AM

## 2020-05-20 NOTE — Discharge Summary (Signed)
Postpartum Discharge Summary   Date of Service updated 05/21/20     Patient Name: Morgan Lawson DOB: Aug 09, 1985 MRN: [id number]  Date of admission: 05/19/2020 Delivery date:05/19/2020  Delivering provider: Arrie Senate  Date of discharge: 05/21/2020  Admitting diagnosis: Normal labor [O80, Z37.9] Vaginal delivery [O80] Intrauterine pregnancy: [redacted]w[redacted]d    Secondary diagnosis:  Active Problems:   Normal labor   Vaginal delivery   Positive GBS test  Additional problems: none    Discharge diagnosis: Term Pregnancy Delivered                                              Post partum procedures:n/a Augmentation: N/A Complications: None  Hospital course: Onset of Labor With Vaginal Delivery      35y.o. yo [id number] admitted in Active Labor on 05/19/2020. Patient had an uncomplicated labor course as follows:  Membrane Rupture Time/Date: 9:30 PM ,05/19/2020   Delivery Method:Vaginal, Spontaneous  Episiotomy: None  Lacerations:  Periurethral  Patient had an uncomplicated postpartum course.  She is ambulating, tolerating a regular diet, passing flatus, and urinating well. Patient is discharged home in stable condition on 05/21/20.  Newborn Data: Birth date:05/19/2020  Birth time:9:32 PM  Gender:Female  Living status:Living  Apgars:9 ,9  Weight:3040 g   Magnesium Sulfate received: No BMZ received: No Rhophylac:N/A MMR:N/A T-DaP:Given prenatally Flu: No Transfusion:No  Physical exam  Vitals:   05/20/20 0854 05/20/20 1228 05/20/20 2100 05/21/20 0520  BP: 103/64 1_0  Pulse: 66 74 78 75  Resp: _1 Temp: 99.2 F (37.3 C) 98.4 F (36.9 C) 98.5 F (36.9 C) 98.1 F (36.7 C)  TempSrc: Oral Oral Oral Oral  SpO2: 98% 100% 98% 99%  Weight:       General: alert, cooperative and no distress Lochia: appropriate Uterine Fundus: firm Incision: N/A DVT Evaluation: No evidence of DVT seen on physical exam. Labs: Lab Results   Component Value Date   WBC 11.8 (H) 05/19/2020   HGB 14.0 05/19/2020   HCT 40.0 05/19/2020   MCV 86.2 05/19/2020   PLT 247 05/19/2020   CMP Latest Ref Rng & Units 08/13/2014  Glucose 65 - 99 mg/dL 86  BUN 6 - 20 mg/dL 9  Creatinine 0.44 - 1.00 mg/dL 0.45  Sodium 135 - 145 mmol/L 136  Potassium 3.5 - 5.1 mmol/L 3.9  Chloride 101 - 111 mmol/L 106  CO2 22 - 32 mmol/L 22  Calcium 8.9 - 10.3 mg/dL 9.1   Edinburgh Score: Edinburgh Postnatal Depression Scale Screening Tool 05/19/2020  I have been able to laugh and see the funny side of things. 1  I have looked forward with enjoyment to things. 2  I have blamed myself unnecessarily when things went wrong. 1  I have been anxious or worried for no good reason. 2  I have felt scared or panicky for no good reason. 2  Things have been getting on top of me. 1  I have been so unhappy that I have had difficulty sleeping. 1  I have felt sad or miserable. 1  I have been so unhappy that I have been crying. 1  The thought of harming myself has occurred to me. 0  Edinburgh Postnatal Depression Scale Total 12     After visit meds:  Allergies as of 05/21/2020  No Known Allergies     Medication List    STOP taking these medications   Implanon 68 MG Impl implant Generic drug: etonogestrel   naproxen 500 MG tablet Commonly known as: NAPROSYN     TAKE these medications   acetaminophen 500 MG tablet Commonly known as: TYLENOL Take 2 tablets (1,000 mg total) by mouth every 6 (six) hours as needed (for pain scale < 4ORtemperature>/=100.5 F).   coconut oil Oil Apply 1 application topically as needed.   ibuprofen 600 MG tablet Commonly known as: ADVIL Take 1 tablet (600 mg total) by mouth every 6 (six) hours as needed.   multivitamin-prenatal 27-0.8 MG Tabs tablet Take 1 tablet by mouth daily at 12 noon.        Discharge home in stable condition Infant Feeding: Breast Infant Disposition:home with mother Discharge  instruction: per After Visit Summary and Postpartum booklet. Activity: Advance as tolerated. Pelvic rest for 6 weeks.  Diet: routine diet Future Appointments:No future appointments. Follow up Visit: Patient instructed to follow up _0  in 4-6 weeks.   Please schedule this patient for a In person postpartum visit in 6 weeks with the following provider: Any provider. Additional Postpartum F/U:none  Low risk pregnancy complicated by: n/a Delivery mode:  Vaginal, Spontaneous  Anticipated Birth Control:  Depo,desires to receive at health department   9/87/2158 Arrie Senate, MD

## 2020-05-20 NOTE — Social Work (Signed)
CSW received consult due to score of 12 on Edinburgh Depression Screen.    CSW met with MOB to assess and offer support. CSW used [employer] #[id number]. CSW introduced self and role. CSW observed infant in bassinet and FOB present in room. MOB provided permission to speak with FOB present. CSW informed MOB of reason for consult. MOB reported she is feeling fine and happy. MOB was observed smiling and laughing. MOB shared that initially she was surprised to find out about the pregnancy because her other children are older. MOB stated she eventually got over the surprise, especially once infant was born. MOB denies any mental health history. MOB identified FOB and a friend as supports. MOB denies any current SI or HI.   CSW provided education regarding the baby blues period versus PPD. CSW provided the New Mom Checklist and encouraged MOB to self evaluate and contact a medical professional if symptoms are noted at any time.  CSW provided review of Sudden Infant Death Syndrome (SIDS) precautions. MOB reported infant will sleep in a crib. MOB stated she has all essentials for infant and denies any barriers to follow-up care. MOB expressed she would like to add a friend to her visitation list. CSW informed MOB of the visitor policies and contacted security to have requested visitor added to list. MOB expressed no additional needs at this time.   CSW identifies no further need for intervention and no barriers to discharge at this time.  Alexus Michael, LCSWA Clinical Social Work [facility] [phone]   

## 2020-05-21 MED ORDER — IBUPROFEN 600 MG PO TABS: 600.0000 mg | ORAL_TABLET | Freq: Four times a day (QID) | ORAL | 0 refills | Status: DC | PRN

## 2020-05-21 MED ORDER — IBUPROFEN 600 MG PO TABS: 600.0000 mg | ORAL_TABLET | Freq: Four times a day (QID) | ORAL | 0 refills | Status: AC | PRN

## 2020-05-21 MED ORDER — COCONUT OIL OIL: 1.0000 "application " | TOPICAL_OIL | 0 refills | Status: AC | PRN

## 2020-05-21 MED ORDER — ACETAMINOPHEN 500 MG PO TABS: 1000.0000 mg | ORAL_TABLET | Freq: Four times a day (QID) | ORAL | Status: AC | PRN

## 2020-05-21 NOTE — Lactation Note (Signed)
This note was copied from a baby's chart. Lactation Consultation Note  Patient Name: Morgan Lawson [id number] Date: 05/21/2020 Reason for consult: Follow-up assessment Age:36 hours   Mother is a P3 with 2 teenages at home. Spanish Interpreter Christy Sartorius ID # [id number].  Mother reports that breastfeeding is going well but her nipples are sore. No positional strips observed. Mother was given comfort gels.   Infant cried and mother picked infant up and she reports that she just fed infant. Father took infant to soothe.  Discussed cue base feeding and cluster feeding. Discussed supply and demand. Mother very receptive to all teaching.  Mother was given a harmony hand pump with instructions and reviewed milk storage guidelines.   Discussed engorgement prevention and treatment.   Mother to continue to cue base feed infant and feed at least 8-12 times or more in 24 hours and advised to allow for cluster feeding infant as needed.  Mother to continue to due STS. Mother is aware of available LC services at [facility], BFSG'S, OP Dept, and phone # for questions or concerns about breastfeeding.  .    Maternal Data    Feeding Mother's Current Feeding Choice: Breast Milk  LATCH Score Latch: Grasps breast easily, tongue down, lips flanged, rhythmical sucking.  Audible Swallowing: A few with stimulation  Type of Nipple: Everted at rest and after stimulation  Comfort (Breast/Nipple): Soft / non-tender  Hold (Positioning): No assistance needed to correctly position infant at breast.  LATCH Score: 9   Lactation Tools Discussed/Used    Interventions Interventions: Comfort gels;Hand pump;Education  Discharge Discharge Education: Engorgement and breast care;Warning signs for feeding baby Pump: Manual  Consult Status Consult Status: Complete    Darla Lesches 05/21/2020, 12:27 PM

## 2020-05-21 NOTE — Progress Notes (Signed)
Patient declines Depo shot today at d/c.  States she will call her OB office and get this medication/birth control in the office after discharge from hospital.   All d/c materials printed in [place] and reviewed in detail with pt and SO at bedside.  Spoke to Resident on call about sending Rx to Ibuprofen 600mg  to [facility].  She understands this med is also available OTC.

## 2020-05-22 ENCOUNTER — Ambulatory Visit: Payer: Self-pay

## 2020-05-22 NOTE — Lactation Note (Signed)
This note was copied from a baby's chart. Lactation Consultation Note  Patient Name: Morgan Lawson [id number]'J Date: 05/22/2020 Reason for consult: Follow-up assessment;Term Age:36 hours  Consult was done in Spanish:  Follow up visit to 72 hours old with 7.89% weight loss. Baby is sleeping in father's arms upon arrival. Mother states breastfeeding is going well. Infant has been been having good voids and stools, per mother.  Feeding plan:  1. Breastfeed following hunger cues.  2. Stimulate infant awake at the breast 3. Offer breast 8 - 12 times in 24h period to establish good milk supply.   4. Encouraged maternal rest, hydration and food intake.  5. Contact Lactation Services or local resources for support, questions or concerns.    All questions answered at this time. Family is waiting to be discharged home today.  Maternal Data Has patient been taught Hand Expression?: Yes Does the patient have breastfeeding experience prior to this delivery?: Yes  Feeding Mother's Current Feeding Choice: Breast Milk  Interventions Interventions: Breast feeding basics reviewed;Assisted with latch;Skin to skin;Breast massage;Expressed milk;Education  Discharge Discharge Education: Engorgement and breast care;Warning signs for feeding baby [place] Program: Yes  Consult Status Consult Status: Complete Date: 05/22/20 Follow-up type: Call as needed    [place] 05/22/2020, 9:10 AM

## 2020-10-06 ENCOUNTER — Other Ambulatory Visit: Payer: Self-pay

## 2020-10-06 DIAGNOSIS — D242 Benign neoplasm of left breast: Secondary | ICD-10-CM

## 2020-11-12 ENCOUNTER — Ambulatory Visit
Admission: RE | Admit: 2020-11-12 | Discharge: 2020-11-12 | Disposition: A | Discharge status: Home / Self Care | Payer: No Typology Code available for payment source | Source: Ambulatory Visit | Attending: Obstetrics and Gynecology | Admitting: Obstetrics and Gynecology

## 2020-11-12 ENCOUNTER — Other Ambulatory Visit: Payer: Self-pay

## 2020-11-12 ENCOUNTER — Ambulatory Visit: Payer: Self-pay | Admitting: *Deleted

## 2020-11-12 VITALS — BP 100/70 | Wt 136.2 lb

## 2020-11-12 DIAGNOSIS — D242 Benign neoplasm of left breast: Secondary | ICD-10-CM

## 2020-11-12 DIAGNOSIS — Z1239 Encounter for other screening for malignant neoplasm of breast: Secondary | ICD-10-CM

## 2020-11-12 DIAGNOSIS — N6320 Unspecified lump in the left breast, unspecified quadrant: Secondary | ICD-10-CM

## 2020-11-12 NOTE — Patient Instructions (Signed)
Explained breast self awareness with Hipolito Bayley. Patient did not need a Pap smear today due to last Pap smear and HPV typing was 08/20/2019. Let her know BCCCP will cover Pap smears and HPV typing every 5 years unless has a history of abnormal Pap smears. Referred patient to the [place] for a left breast ultrasound per recommendation. Appointment scheduled Thursday, November 12, 2020 at 1250. Patient aware of appointment and will be there. [place] verbalized understanding.  Casey Maxfield, Arvil Chaco, RN 11:49 AM

## 2020-11-12 NOTE — Progress Notes (Signed)
[name] is a 35 y.o. female who presents to [facility] clinic today with no complaints. Patient has a left breast lump that has been followed since November 2015. Patients last left breast ultrasound was completed 05/07/2020 that a 39-monthleft breast ultrasound was recommended for follow-up. Patient stated sometimes she feels the lump and other times she doesn't.    Pap Smear: Pap smear not completed today. [facility] and was normal with negative HPV. Per patient has no history of an abnormal Pap smear. Last Pap smear result is available in Epic.   Physical exam: Breasts Breasts symmetrical. No skin abnormalities bilateral breasts. No nipple retraction bilateral breasts. Patient is currently breastfeeding her 547-monthaby. No lymphadenopathy. No lumps palpated right breast. Palpated a lump within the left breast at 2:30 o'clock 5 cm from the nipple. No complaints of pain or tenderness on exam.  Pelvic/Bimanual Pap is not indicated today per BCCCP guidelines.   Smoking History: Patient has never smoked.   Patient Navigation: Patient education provided. Access to services provided for patient through [facility]. Spanish interpreter ErRudene Andarom [facility].    Breast and Cervical Cancer Risk Assessment: Patient does not have family history of breast cancer, known genetic mutations, or radiation treatment to the chest before age 894Patient does not have history of cervical dysplasia, immunocompromised, or DES exposure in-utero.  Risk Assessment     Risk Scores       11/12/2020 10/31/2019   Last edited by: McDemetrius RevelLPN McRoyston BakeCMA   5-year risk: 0.2 % 0.3 %   Lifetime risk: 7.1 % 10.1 %            A: BCCCP exam without pap smear No complaints.  P: [facility] per recommendation. Appointment  scheduled Thursday, November 12, 2020 at 1250.  BrLoletta ParishRN 11/12/2020 11:36 AM

## 2021-02-16 IMAGING — US US BREAST*L* LIMITED INC AXILLA
1 series · 13 of 17 positions shown · non-contrast
Comparison: Previous exam(s).

CLINICAL DATA: 34-year-old female who is 7 weeks pregnant
presenting for evaluation of a palpable lump in the superior left
breast on clinical breast exam. The patient states that she has a
palpable lump in the lateral left breast. She had a biopsy of a mass
in the left breast in 2802 with pathology indicating fibroadenoma.

EXAM:
ULTRASOUND OF THE LEFT BREAST

[Series 1: us breast*left* limited inc axilla · 0.06mm/px · 13 of 17 slices shown]
[im 1/17]
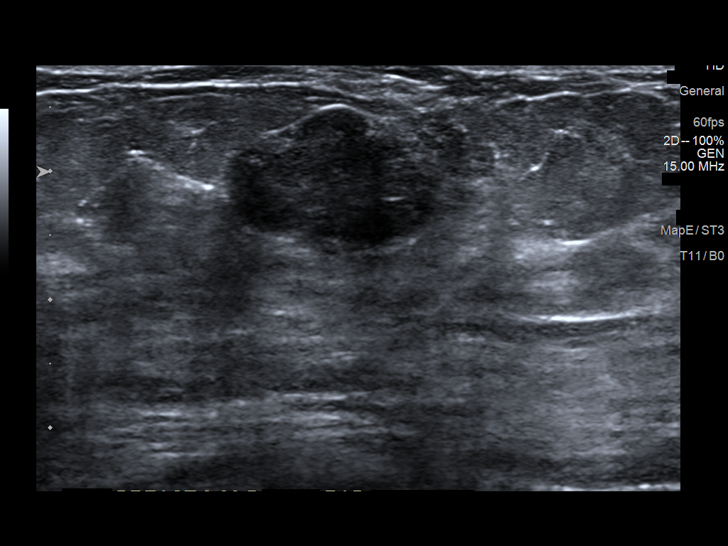
[im 2/17]
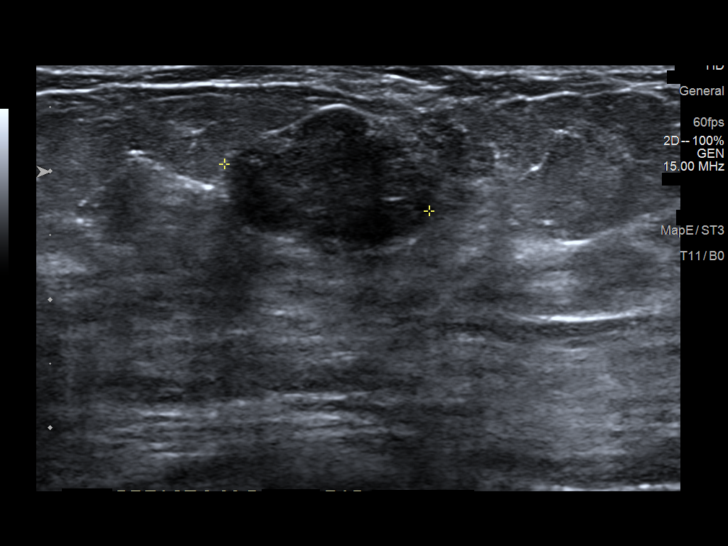
[im 4/17]
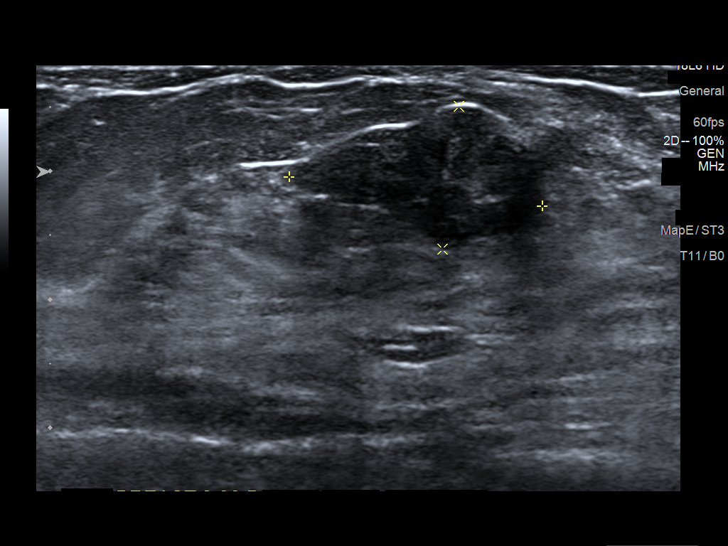
[im 5/17]
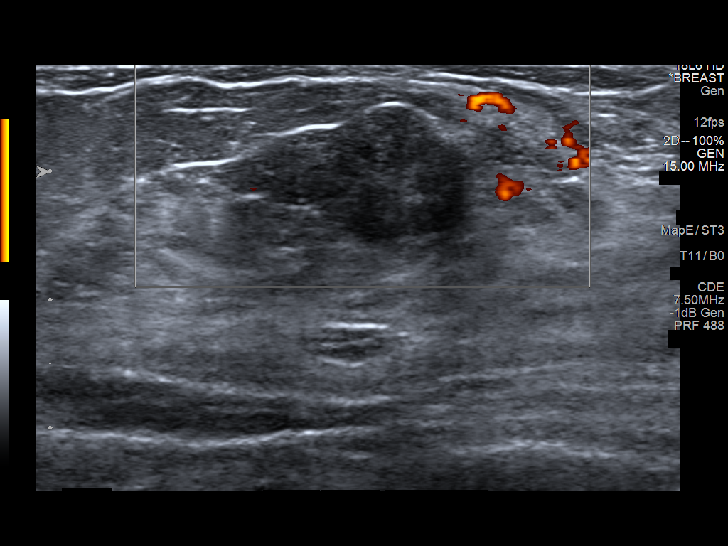
[im 6/17]
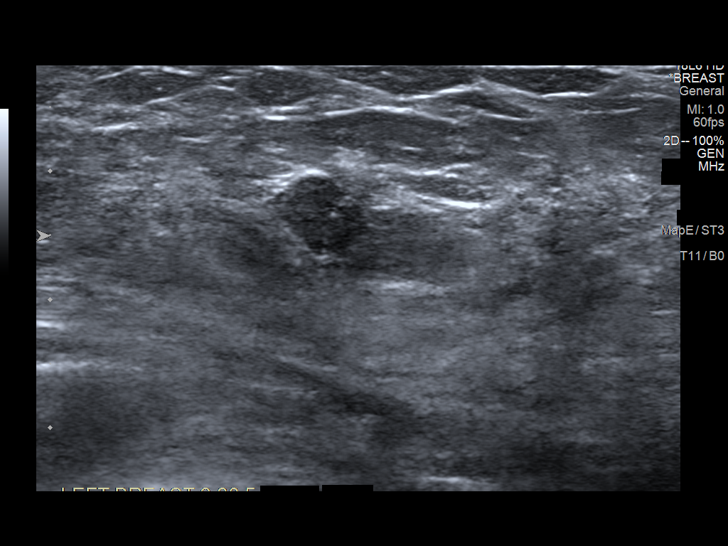
[im 8/17]
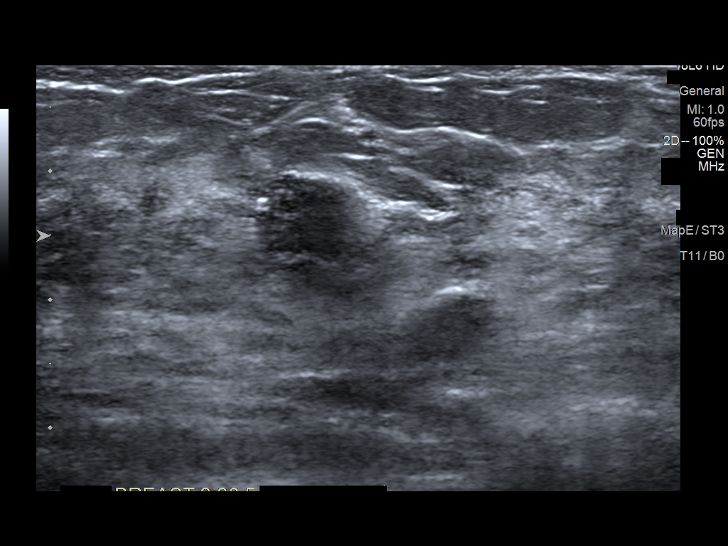
[im 9/17]
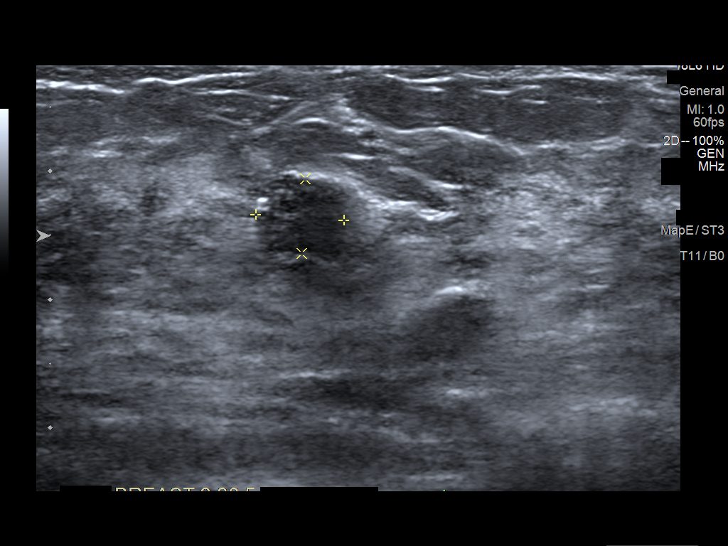
[im 10/17]
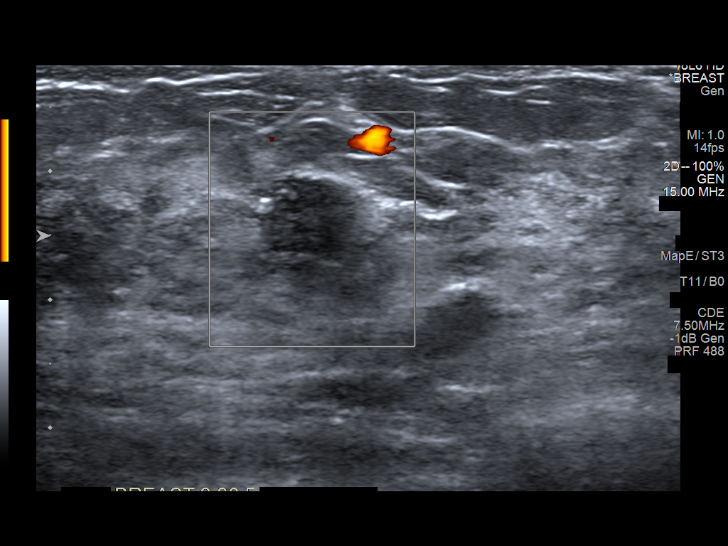
[im 12/17]
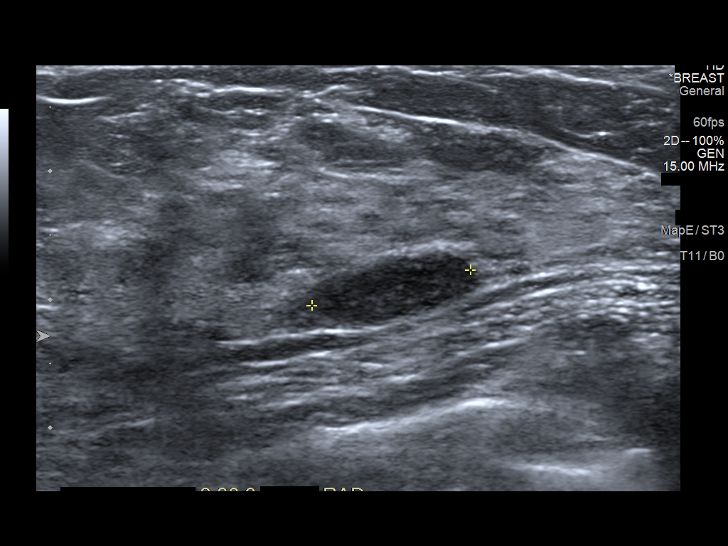
[im 13/17]
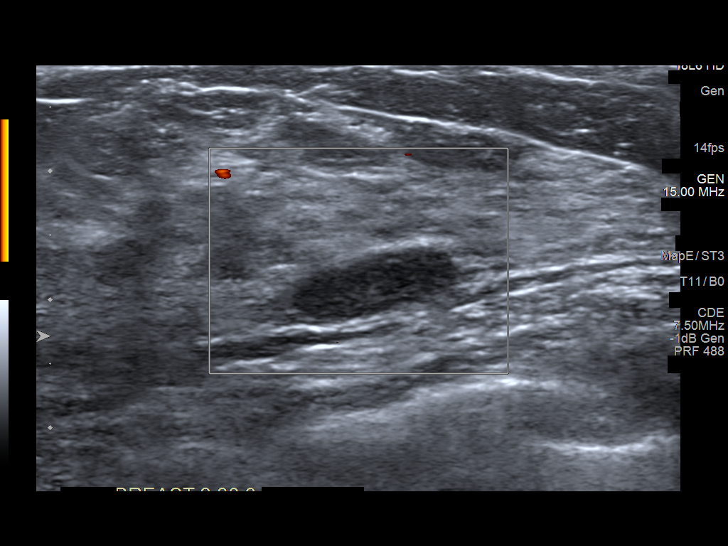
[im 14/17]
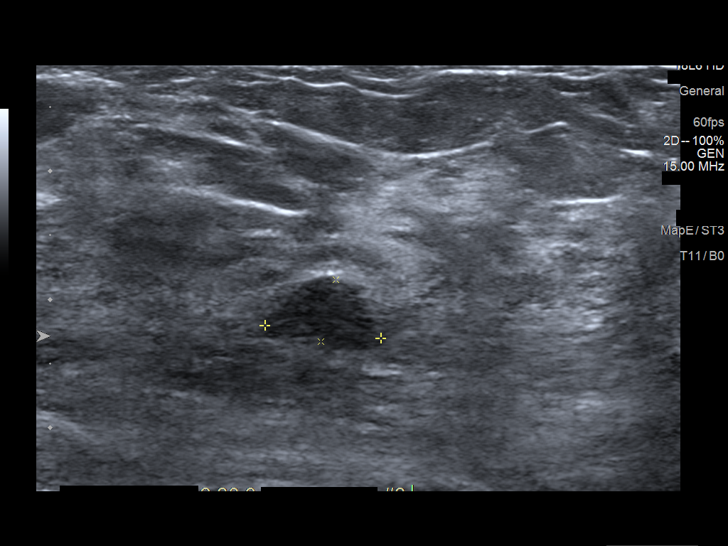
[im 16/17]
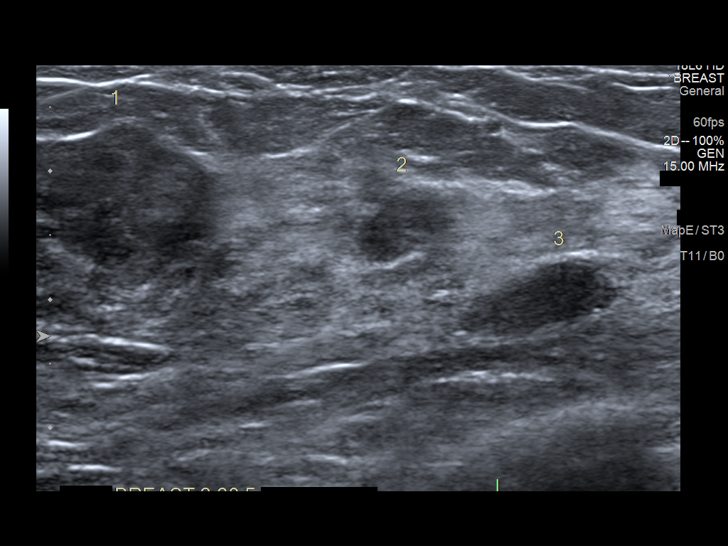
[im 17/17]
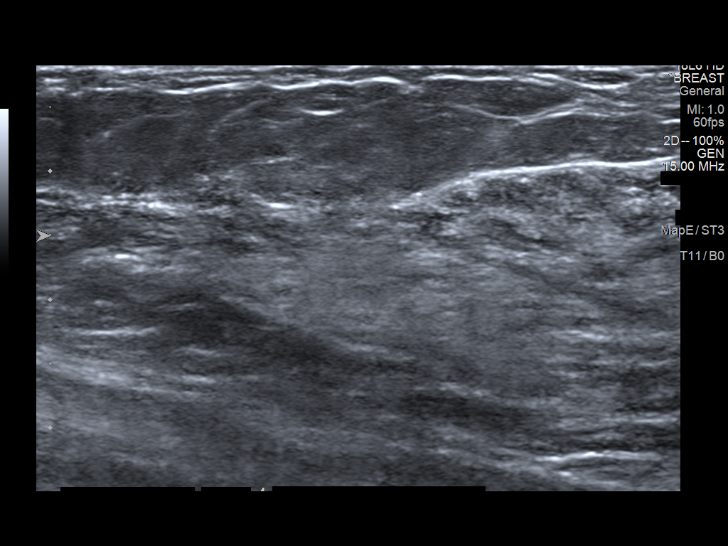

[13 of 17 positions shown; findings below may reference images not displayed]

FINDINGS: Ultrasound of the left breast at 2 o'clock, 5 cm from the nipple
demonstrates a stable oval hypoechoic mass measuring 2.0 x 1.1 x
cm, previously 2.0 x 1.2 x 1.6 cm. This mass was previously
biopsied and found to be a benign fibroadenoma in Tuesday February, 2014.

There is an adjacent mass at [DATE], 5 cm from the nipple measuring
0.7 x 0.6 x 0.7 cm.

At [DATE], 6 cm from the nipple, there is a circumscribed oval
hypoechoic mass measuring 1.3 x 0.5 x 0.9 cm.

Ultrasound targeted to the palpable area in the superior left breast
demonstrates normal fibroglandular tissue. No suspicious masses or
areas of shadowing are identified.
IMPRESSION: 1. There are 2 likely benign masses in the left breast at [DATE],
favored to represent fibroadenomas.

2. There is a stable mass in the left breast at 2 o'clock which
corresponds with the biopsy proven fibroadenoma.

3. No masses are seen in the superior left breast to account for the
palpable lump identified on clinical breast exam.

RECOMMENDATION:
1. Six-month follow-up left breast ultrasound is recommended to
monitor the 2 left breast masses at [DATE].

2. Clinical follow-up recommended for the palpable area of concern
in the superior left breast. Any further workup should be based on
clinical grounds.

I have discussed the findings and recommendations with the patient.
If applicable, a reminder letter will be sent to the patient
regarding the next appointment.

BI-RADS CATEGORY  3: Probably benign.

## 2021-08-24 IMAGING — US US BREAST*L* LIMITED INC AXILLA
1 series · 9 of 9 positions shown · non-contrast
Comparison: Previous exam(s).

CLINICAL DATA: Follow-up for 2 probably benign masses within the
LEFT breast at the 2:30 o'clock axis, most likely benign
fibroadenomas. History of multiple additional benign LEFT breast
fibroadenomas including a biopsy-proven fibroadenoma in the LEFT
breast in 1562.

EXAM:
ULTRASOUND OF THE LEFT BREAST

[Series 1: us breast*left* limited inc axilla · 0.06mm/px · 9 of 9 slices shown]
[im 1/9]
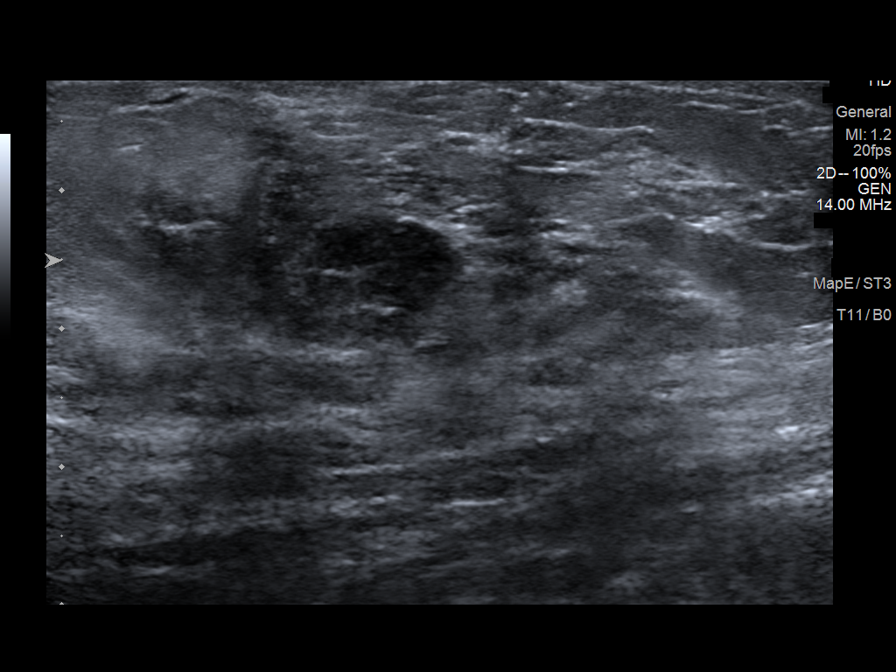
[im 2/9]
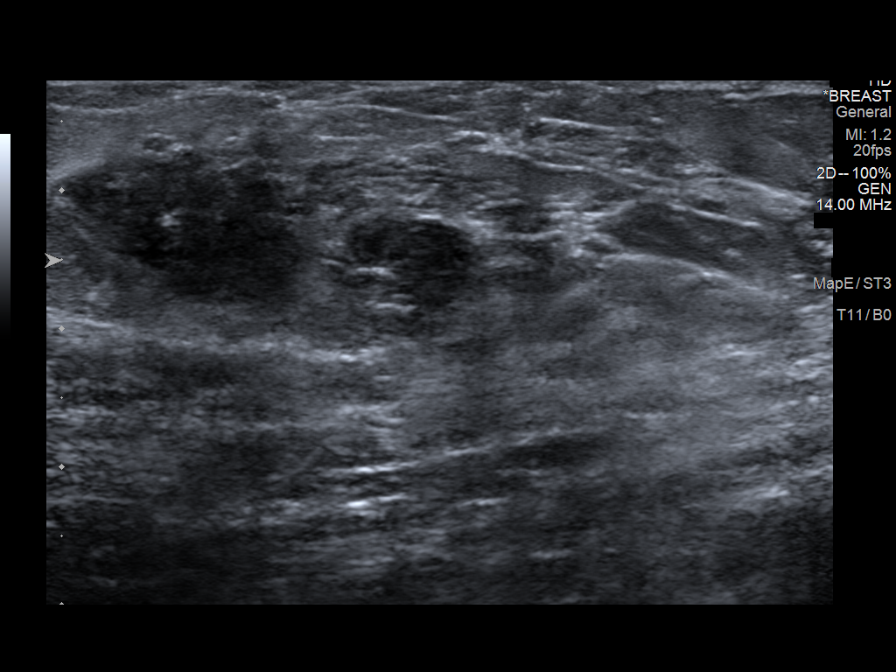
[im 3/9]
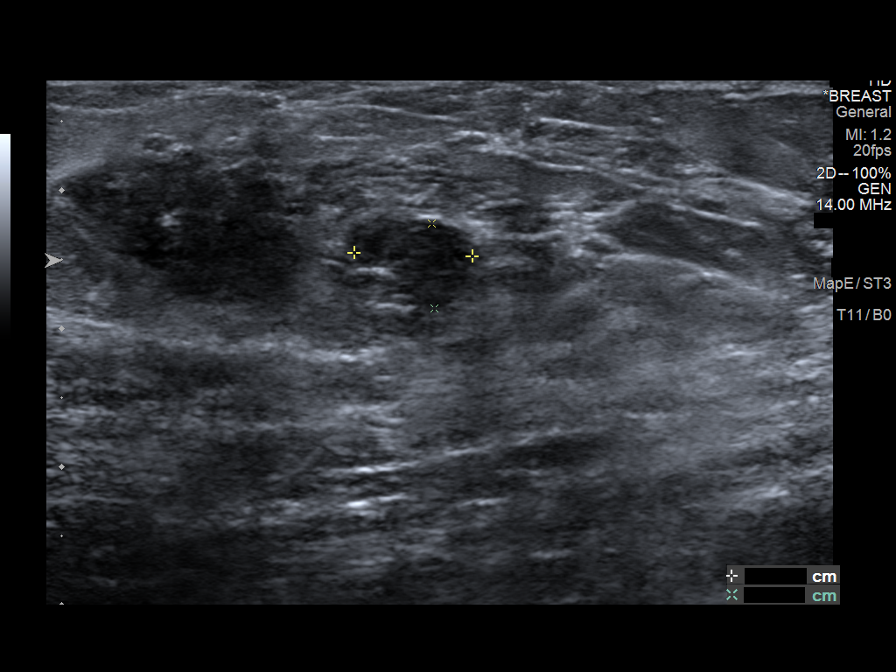
[im 4/9]
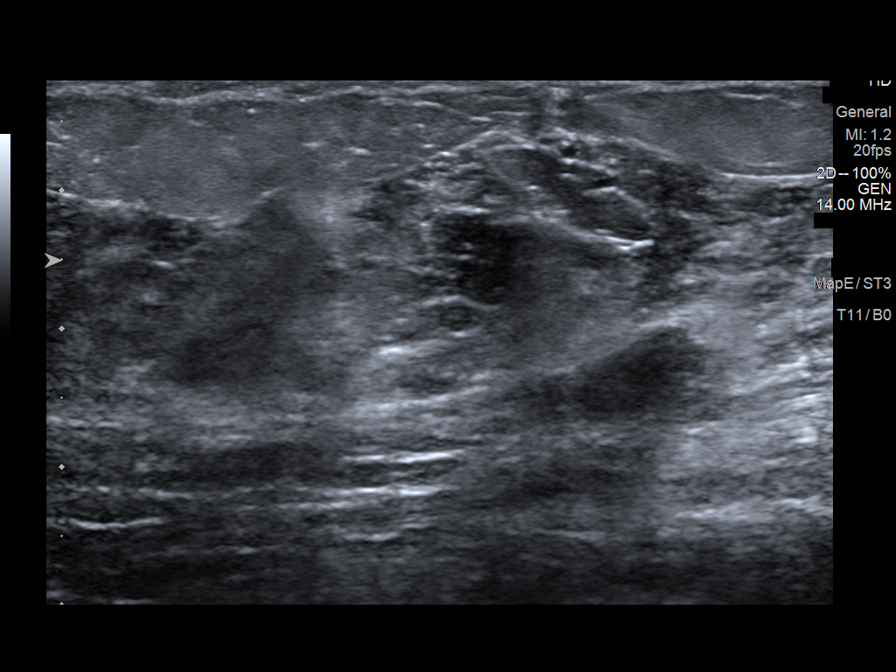
[im 5/9]
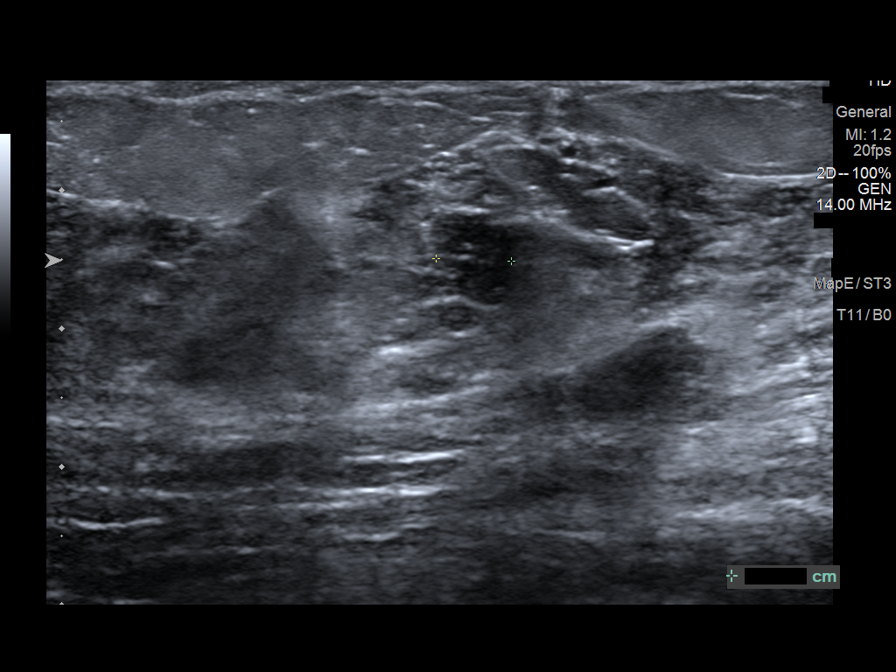
[im 6/9]
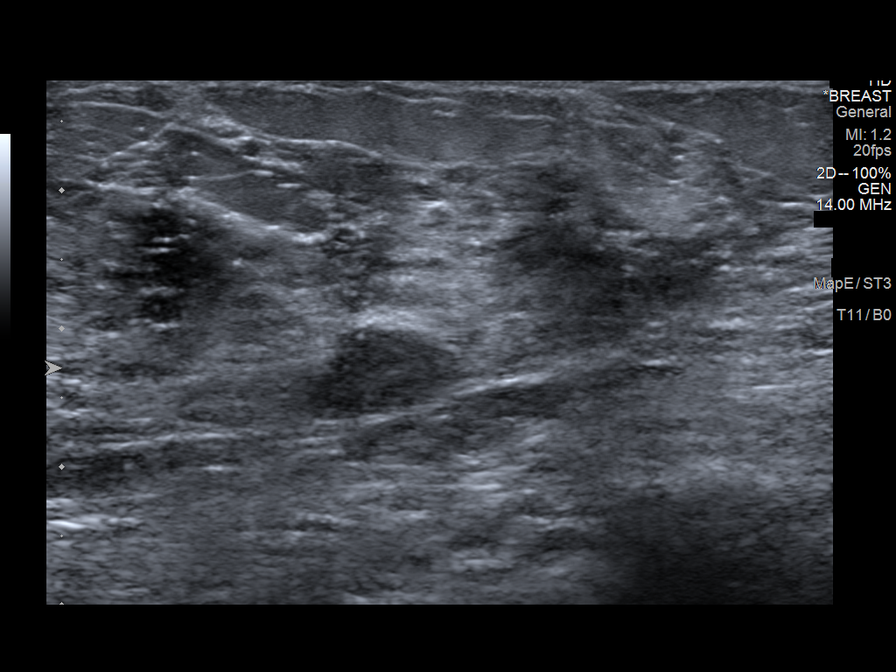
[im 7/9]
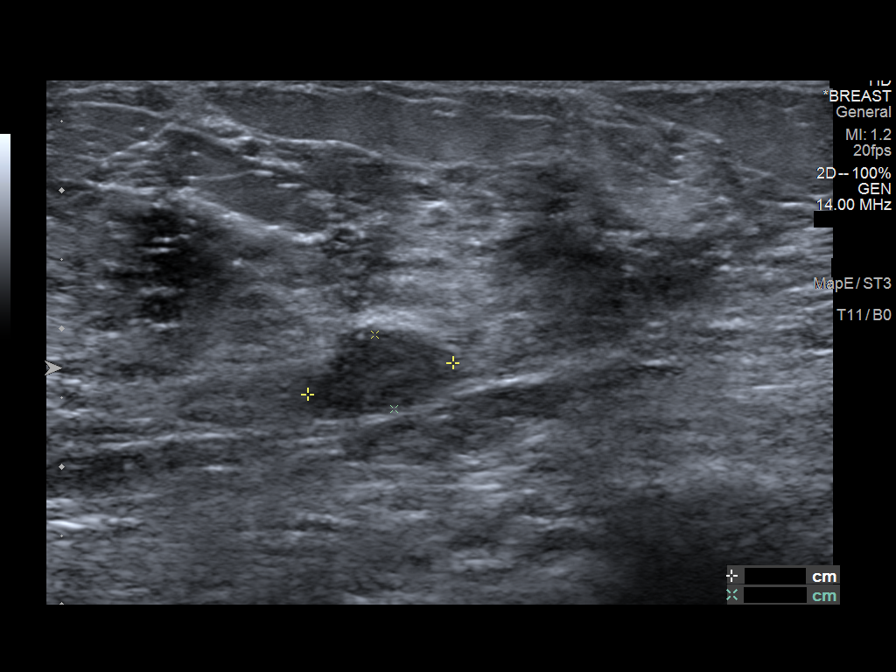
[im 8/9]
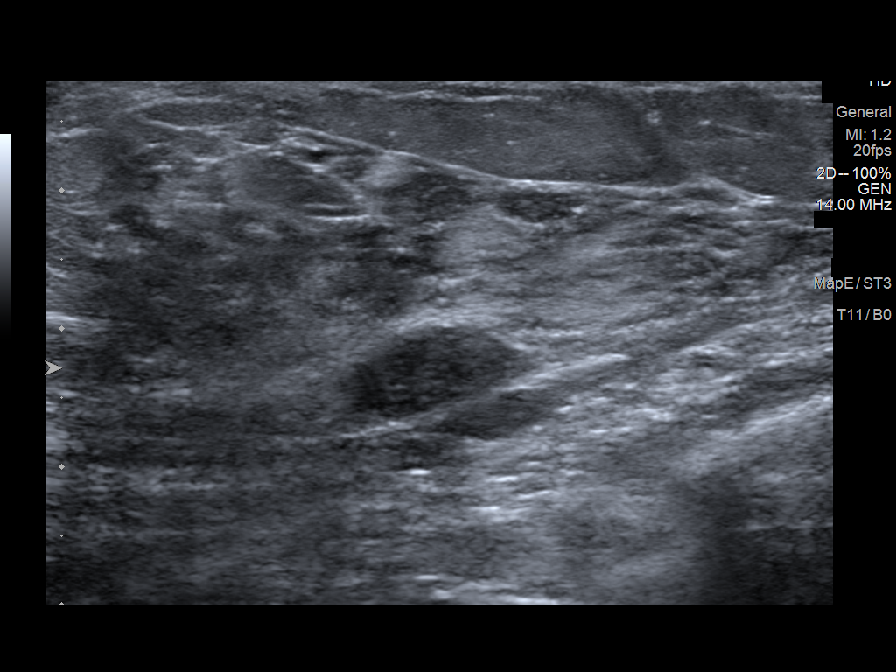
[im 9/9]
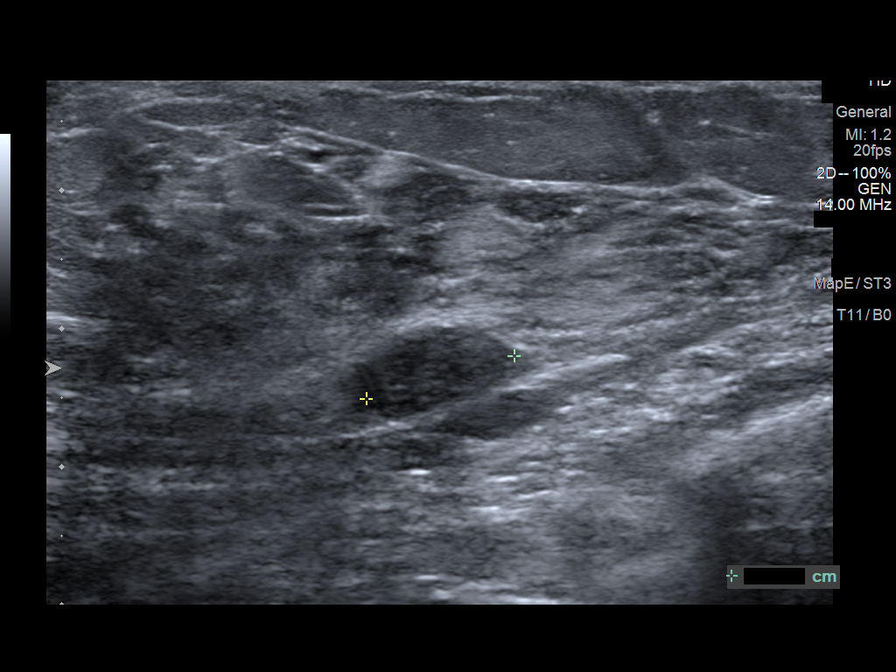

[9 of 9 positions shown; findings below may reference images not displayed]

FINDINGS: Targeted ultrasound is performed, again showing an oval
circumscribed hypoechoic mass in the LEFT breast at the 2:30 o'clock
axis, 5 cm from the nipple, measuring 9 x 5 x 6 mm, not
significantly changed compared to the previous exam.

Also again shown is an oval circumscribed hypoechoic mass in the
LEFT breast at the 2:30 o'clock axis, 6 cm from the nipple,
measuring 11 x 6 x 11 mm, not significantly changed compared to the
previous exam.
IMPRESSION: Two probably benign fibroadenomas within the LEFT breast at the 2:30
o'clock axis, with measurements provided above, both of which are
not significantly changed compared to previous ultrasound of
10/31/2019. These 2 fibroadenomas were initially identified on the
ultrasound of 10/31/2019, therefore, additional follow-up ultrasound
is recommended in 6 months to ensure continued stability.

RECOMMENDATION:
LEFT breast ultrasound in 6 months for the 2 probably benign
fibroadenomas in the LEFT breast at the 2:30 o'clock axis.

I have discussed the findings and recommendations with the patient
with the aid of an interpreter. If applicable, a reminder letter
will be sent to the patient regarding the next appointment.

BI-RADS CATEGORY  3: Probably benign.

## 2021-09-21 ENCOUNTER — Other Ambulatory Visit: Payer: Self-pay

## 2021-09-21 DIAGNOSIS — D242 Benign neoplasm of left breast: Secondary | ICD-10-CM

## 2021-11-18 ENCOUNTER — Other Ambulatory Visit: Payer: No Typology Code available for payment source

## 2021-11-18 ENCOUNTER — Ambulatory Visit: Payer: No Typology Code available for payment source

## 2021-11-30 ENCOUNTER — Ambulatory Visit: Payer: No Typology Code available for payment source

## 2021-11-30 ENCOUNTER — Ambulatory Visit: Payer: Self-pay | Admitting: *Deleted

## 2021-11-30 ENCOUNTER — Ambulatory Visit
Admission: RE | Admit: 2021-11-30 | Discharge: 2021-11-30 | Disposition: A | Discharge status: Home / Self Care | Payer: No Typology Code available for payment source | Source: Ambulatory Visit | Attending: Obstetrics and Gynecology | Admitting: Obstetrics and Gynecology

## 2021-11-30 VITALS — BP 120/82 | Wt 141.4 lb

## 2021-11-30 DIAGNOSIS — N6321 Unspecified lump in the left breast, upper outer quadrant: Secondary | ICD-10-CM

## 2021-11-30 DIAGNOSIS — D242 Benign neoplasm of left breast: Secondary | ICD-10-CM

## 2021-11-30 DIAGNOSIS — Z1239 Encounter for other screening for malignant neoplasm of breast: Secondary | ICD-10-CM

## 2021-11-30 NOTE — Patient Instructions (Signed)
Explained breast self awareness with Hipolito Bayley. Patient did not need a Pap smear today due to last Pap smear and HPV typing was 08/20/2019 per patient. Let her know BCCCP will cover Pap smears and HPV typing every 5 years unless has a history of abnormal Pap smears. Referred patient to the [facility] for a left breast ultrasound per recommendation. Appointment scheduled Tuesday, November 30, 2021 at 1410. Patient aware of appointment and will be there. [place] verbalized understanding.  Alyxandra Tenbrink, Arvil Chaco, RN 11:47 AM

## 2021-11-30 NOTE — Progress Notes (Signed)
[name] is a 36 y.o. female who presents to [facility] clinic today with complaint of left breast lump that has been followed since November 2015. Patients last left breast ultrasound was completed 11/12/2020 that a one year left breast ultrasound was recommended for follow-up. Patient stated that she hasn't noticed any changes with the lump.   Pap Smear: Pap smear not completed today. Last Pap smear was 08/20/2019 at the [facility] clinic and was normal with negative HPV. Per patient has no history of an abnormal Pap smear. Last Pap smear result is available in Epic.   Physical exam: Breasts Breasts symmetrical. No skin abnormalities bilateral breasts. No nipple retraction bilateral breasts. No nipple discharge bilateral breasts. No lymphadenopathy. No lumps palpated right breast. Palpated a lump within the left breast at 2:30 o'clock 5 cm from the nipple. No complaints of pain or tenderness on exam.  Pelvic/Bimanual Pap is not indicated today per BCCCP guidelines.   Smoking History: Patient has never smoked.   Patient Navigation: Patient education provided. Access to services provided for patient through [place] program. Spanish interpreter Morgan Lawson from [facility] provided.    Breast and Cervical Cancer Risk Assessment: Patient does not have family history of breast cancer, known genetic mutations, or radiation treatment to the chest before age 23. Patient does not have history of cervical dysplasia, immunocompromised, or DES exposure in-utero.  Risk Assessment     Risk Scores       11/30/2021 11/12/2020   Last edited by: Morgan Lawson, CMA Morgan Lawson, Morgan Lawson Morgan Nick, LPN   5-year risk: 0.3 % 0.2 %   Lifetime risk: 7.1 % 7.1 %            A: BCCCP exam without pap smear No complaints.  P: Referred patient to the [place] for a left breast ultrasound per recommendation. Appointment scheduled Tuesday, November 30, 2021 at  1410.    Morgan Parish, RN 11/30/2021 11:47 AM

## 2022-05-03 DIAGNOSIS — Z8619 Personal history of other infectious and parasitic diseases: Secondary | ICD-10-CM | POA: Insufficient documentation

## 2022-05-03 DIAGNOSIS — D242 Benign neoplasm of left breast: Secondary | ICD-10-CM | POA: Insufficient documentation

## 2022-05-04 ENCOUNTER — Encounter: Payer: Self-pay | Admitting: Physician Assistant

## 2022-06-01 NOTE — Progress Notes (Deleted)
06/01/2022 Marloe Trudo [id number] 01-15-1986  Referring provider: Cathleen Corti, PA-C Primary GI doctor: {acdocs:[id number]}  ASSESSMENT AND PLAN:   There are no diagnoses linked to this encounter.   Patient Care Team: Patient, No Pcp Per as PCP - General (General Practice) Patient, No Pcp Per (General Practice)  HISTORY OF PRESENT ILLNESS: 37 y.o. female with a past medical history of ***and others listed below presents for evaluation of ***.    She  reports that she has never smoked. She has never used smokeless tobacco. She reports that she does not drink alcohol and does not use drugs.  RELEVANT LABS AND IMAGING: CBC    Component Value Date/Time   WBC 11.8 (H) 05/19/2020 2150   RBC 4.64 05/19/2020 2150   HGB 14.0 05/19/2020 2150   HCT 40.0 05/19/2020 2150   PLT 247 05/19/2020 2150   MCV 86.2 05/19/2020 2150   MCH 30.2 05/19/2020 2150   MCHC 35.0 05/19/2020 2150   RDW 14.5 05/19/2020 2150   LYMPHSABS 2.1 08/13/2014 1000   MONOABS 0.5 08/13/2014 1000   EOSABS 0.1 08/13/2014 1000   BASOSABS 0.0 08/13/2014 1000   No results for input(s): "HGB" in the last 8760 hours.   CMP     Component Value Date/Time   NA 136 08/13/2014 1000   K 3.9 08/13/2014 1000   CL 106 08/13/2014 1000   CO2 22 08/13/2014 1000   GLUCOSE 86 08/13/2014 1000   BUN 9 08/13/2014 1000   CREATININE 0.45 08/13/2014 1000   CALCIUM 9.1 08/13/2014 1000   GFRNONAA >60 08/13/2014 1000   GFRAA >60 08/13/2014 1000       No data to display            Current Medications:      Current Outpatient Medications (Analgesics):    acetaminophen (TYLENOL) 500 MG tablet, Take 2 tablets (1,000 mg total) by mouth every 6 (six) hours as needed (for pain scale < 4  OR  temperature  >/=  100.5 F). (Patient not taking: Reported on 11/30/2021)   ibuprofen (ADVIL) 600 MG tablet, Take 1 tablet (600 mg total) by mouth every 6 (six) hours as needed. (Patient not taking: Reported on  11/30/2021)   Current Outpatient Medications (Other):    coconut oil OIL, Apply 1 application topically as needed. (Patient not taking: Reported on 11/30/2021)   Prenatal Vit-Fe Fumarate-FA (MULTIVITAMIN-PRENATAL) 27-0.8 MG TABS tablet, Take 1 tablet by mouth daily at 12 noon. (Patient not taking: Reported on 11/30/2021)  Medical History:  Past Medical History:  Diagnosis Date   Medical history non-contributory    Allergies: No Known Allergies   Surgical History:  She  has a past surgical history that includes No past surgeries. Family History:  Her family history includes Diabetes in her paternal grandfather; Seizures in her sister.  REVIEW OF SYSTEMS  : All other systems reviewed and negative except where noted in the History of Present Illness.  PHYSICAL EXAM: There were no vitals taken for this visit. General Appearance: Well nourished, in no apparent distress. Head:   Normocephalic and atraumatic. Eyes:  sclerae anicteric,conjunctive pink  Respiratory: Respiratory effort normal, BS equal bilaterally without rales, rhonchi, wheezing. Cardio: RRR with no MRGs. Peripheral pulses intact.  Abdomen: Soft,  {BlankSingle:19197::"Flat","Obese","Non-distended"} ,active bowel sounds. {actendernessAB:27319} tenderness {anatomy; site abdomen:5010}. {BlankMultiple:19196::"Without guarding","With guarding","Without rebound","With rebound"}. No masses. Rectal: {acrectalexam:27461} Musculoskeletal: Full ROM, {PSY - GAIT AND STATION:22860} gait. {With/Without:304960234} edema. Skin:  Dry and intact without significant lesions or  rashes Neuro: Alert and  oriented x4;  No focal deficits. Psych:  Cooperative. Normal mood and affect.    Vladimir Crofts, PA-C 2:57 PM

## 2022-06-08 ENCOUNTER — Ambulatory Visit: Payer: No Typology Code available for payment source | Admitting: Physician Assistant

## 2023-02-13 ENCOUNTER — Other Ambulatory Visit (HOSPITAL_COMMUNITY): Payer: Self-pay | Admitting: Internal Medicine

## 2023-02-13 DIAGNOSIS — R1013 Epigastric pain: Secondary | ICD-10-CM

## 2023-02-16 ENCOUNTER — Ambulatory Visit (HOSPITAL_COMMUNITY)
Admission: RE | Admit: 2023-02-16 | Discharge: 2023-02-16 | Disposition: A | Discharge status: Home / Self Care | Payer: Self-pay | Source: Ambulatory Visit | Attending: Internal Medicine | Admitting: Internal Medicine

## 2023-02-16 DIAGNOSIS — R1013 Epigastric pain: Secondary | ICD-10-CM | POA: Insufficient documentation

## 2023-03-15 NOTE — L&D Delivery Note (Addendum)
 OB/GYN Faculty Practice Delivery Note  Veora Fonte is a 38 y.o. [id number] s/p SVD at [redacted]w[redacted]d. She was admitted for SROM.   ROM: 85h 69m with clear fluid GBS Status: Negative/-- (03/07 0000) Maximum Maternal Temperature: 99.44F   Labor Progress: Initial SVE: 2/50/-3. She then progressed to complete.   Delivery Date/Time: 06/19/23 0451 Delivery: Called to room and patient feeling increased pressure. I rechecked pt's cervix and cervix was 8-9cm/100/-1. I offered AROM and pt accepted, AROM performed with small amount of clear fluid. On next contraction, pt reports increasing urge to push. On recheck pt was complete and commenced pushing. Head delivered ROA with compound left hand presentation. A loose nuchal cord was present and reduced at the perineum. Shoulder and body delivered in usual fashion. Infant with spontaneous cry, placed on mother's abdomen, dried and stimulated. Cord clamped x 2 after 1-minute delay, and cut by father of baby, Caron Presume. Cord blood drawn. Placenta delivered spontaneously with gentle cord traction. Fundus firm with massage and Pitocin. Labia, perineum, vagina, and cervix inspected inspected with a hemostatic left labial laceration noted, which did not require repair.  Baby Weight: pending  Placenta: Sent to L&D Complications: None Lacerations: Left labial laceration - not repaired EBL: 50 mL Analgesia: Unmedicated   Infant:  APGAR (1 MIN): 9  APGAR (5 MINS): 9   Sundra Aland, MD OB Fellow, Faculty Practice [facility], Center for [facility]

## 2023-05-19 LAB — OB RESULTS CONSOLE GBS: GBS: NEGATIVE

## 2023-06-18 ENCOUNTER — Other Ambulatory Visit: Payer: Self-pay

## 2023-06-18 ENCOUNTER — Encounter (HOSPITAL_COMMUNITY): Payer: Self-pay | Admitting: *Deleted

## 2023-06-18 ENCOUNTER — Inpatient Hospital Stay (HOSPITAL_COMMUNITY)
Admission: AD | Admit: 2023-06-18 | Discharge: 2023-06-20 | DRG: 807 | Disposition: A | Discharge status: Home / Self Care | Payer: MEDICAID | Attending: Obstetrics & Gynecology | Admitting: Obstetrics & Gynecology

## 2023-06-18 DIAGNOSIS — Z3A40 40 weeks gestation of pregnancy: Secondary | ICD-10-CM

## 2023-06-18 DIAGNOSIS — O326XX0 Maternal care for compound presentation, not applicable or unspecified: Secondary | ICD-10-CM | POA: Diagnosis present

## 2023-06-18 DIAGNOSIS — O4212 Full-term premature rupture of membranes, onset of labor more than 24 hours following rupture: Principal | ICD-10-CM | POA: Diagnosis present

## 2023-06-18 DIAGNOSIS — Z833 Family history of diabetes mellitus: Secondary | ICD-10-CM

## 2023-06-18 DIAGNOSIS — O4290 Premature rupture of membranes, unspecified as to length of time between rupture and onset of labor, unspecified weeks of gestation: Principal | ICD-10-CM | POA: Diagnosis present

## 2023-06-18 DIAGNOSIS — Z603 Acculturation difficulty: Secondary | ICD-10-CM | POA: Diagnosis present

## 2023-06-18 DIAGNOSIS — O411230 Chorioamnionitis, third trimester, not applicable or unspecified: Secondary | ICD-10-CM | POA: Diagnosis not present

## 2023-06-18 DIAGNOSIS — O480 Post-term pregnancy: Secondary | ICD-10-CM | POA: Diagnosis not present

## 2023-06-18 DIAGNOSIS — O4292 Full-term premature rupture of membranes, unspecified as to length of time between rupture and onset of labor: Principal | ICD-10-CM | POA: Diagnosis present

## 2023-06-18 DIAGNOSIS — O26893 Other specified pregnancy related conditions, third trimester: Secondary | ICD-10-CM | POA: Diagnosis present

## 2023-06-18 DIAGNOSIS — O4202 Full-term premature rupture of membranes, onset of labor within 24 hours of rupture: Secondary | ICD-10-CM | POA: Diagnosis not present

## 2023-06-18 LAB — TYPE AND SCREEN
ABO/RH(D): O POS
Antibody Screen: NEGATIVE

## 2023-06-18 LAB — URINALYSIS, ROUTINE W REFLEX MICROSCOPIC
Bilirubin Urine: NEGATIVE
Glucose, UA: NEGATIVE mg/dL
Hgb urine dipstick: NEGATIVE
Ketones, ur: NEGATIVE mg/dL
Leukocytes,Ua: NEGATIVE
Nitrite: NEGATIVE
Protein, ur: NEGATIVE mg/dL
Specific Gravity, Urine: 1.012 (ref 1.005–1.030)
pH: 6 (ref 5.0–8.0)

## 2023-06-18 LAB — CBC
HCT: 39.4 % (ref 36.0–46.0)
Hemoglobin: 13.5 g/dL (ref 12.0–15.0)
MCH: 29.5 pg (ref 26.0–34.0)
MCHC: 34.3 g/dL (ref 30.0–36.0)
MCV: 86 fL (ref 80.0–100.0)
Platelets: 256 10*3/uL (ref 150–400)
RBC: 4.58 MIL/uL (ref 3.87–5.11)
RDW: 15.3 % (ref 11.5–15.5)
WBC: 9.8 10*3/uL (ref 4.0–10.5)
nRBC: 0 % (ref 0.0–0.2)

## 2023-06-18 MED ORDER — LIDOCAINE HCL (PF) 1 % IJ SOLN: 30.0000 mL | INTRAMUSCULAR | Status: DC | PRN

## 2023-06-18 MED ORDER — ACETAMINOPHEN 325 MG PO TABS
650.0000 mg | ORAL_TABLET | ORAL | Status: DC | PRN
Start: 2023-06-18 — End: 2023-06-19

## 2023-06-18 MED ORDER — OXYCODONE-ACETAMINOPHEN 5-325 MG PO TABS: 2.0000 | ORAL_TABLET | ORAL | Status: DC | PRN

## 2023-06-18 MED ORDER — SOD CITRATE-CITRIC ACID 500-334 MG/5ML PO SOLN
30.0000 mL | ORAL | Status: DC | PRN
  Administered 2023-06-19: 30 mL via ORAL
  Filled 2023-06-18: qty 30

## 2023-06-18 MED ORDER — OXYTOCIN-SODIUM CHLORIDE 30-0.9 UT/500ML-% IV SOLN
2.5000 [IU]/h | INTRAVENOUS | Status: DC
  Filled 2023-06-18: qty 500

## 2023-06-18 MED ORDER — OXYTOCIN-SODIUM CHLORIDE 30-0.9 UT/500ML-% IV SOLN
1.0000 m[IU]/min | INTRAVENOUS | Status: DC
  Administered 2023-06-18: 2 m[IU]/min via INTRAVENOUS

## 2023-06-18 MED ORDER — LACTATED RINGERS IV SOLN: 500.0000 mL | INTRAVENOUS | Status: DC | PRN

## 2023-06-18 MED ORDER — TERBUTALINE SULFATE 1 MG/ML IJ SOLN: 0.2500 mg | Freq: Once | INTRAMUSCULAR | Status: DC | PRN

## 2023-06-18 MED ORDER — ONDANSETRON HCL 4 MG/2ML IJ SOLN: 4.0000 mg | Freq: Four times a day (QID) | INTRAMUSCULAR | Status: DC | PRN

## 2023-06-18 MED ORDER — OXYTOCIN BOLUS FROM INFUSION
333.0000 mL | Freq: Once | INTRAVENOUS | Status: AC
  Administered 2023-06-19: 333 mL via INTRAVENOUS

## 2023-06-18 MED ORDER — FENTANYL CITRATE (PF) 100 MCG/2ML IJ SOLN
50.0000 ug | INTRAMUSCULAR | Status: DC | PRN
Start: 2023-06-18 — End: 2023-06-19

## 2023-06-18 MED ORDER — LACTATED RINGERS IV SOLN: INTRAVENOUS | Status: DC

## 2023-06-18 MED ORDER — OXYCODONE-ACETAMINOPHEN 5-325 MG PO TABS: 1.0000 | ORAL_TABLET | ORAL | Status: DC | PRN

## 2023-06-18 MED ORDER — FLEET ENEMA RE ENEM: 1.0000 | ENEMA | RECTAL | Status: DC | PRN

## 2023-06-18 NOTE — MAU Provider Note (Signed)
 Event Date/Time   First Provider Initiated Contact with Patient 06/18/23 1706       S: [name] is a 38 y.o. Z6X0960 at [redacted]w[redacted]d  who presents to MAU today complaining of leaking of fluid since earlier today. She reports one episode of leaking clear fluid, non since. She denies vaginal bleeding. She endorses contractions. She reports normal fetal movement.    O: BP 130/81   Pulse 83   Temp 98.4 F (36.9 C) (Oral)   Resp 16   Ht 4\' 11"  (1.499 m)   Wt 75.3 kg   SpO2 100%   BMI 33.53 kg/m  GENERAL: Well-developed, well-nourished female in no acute distress.  HEAD: Normocephalic, atraumatic.  CHEST: Normal effort of breathing, regular heart rate ABDOMEN: Soft, nontender, gravid  Cervical exam:  Dilation: 2 Effacement (%): 50 Station: -3 Presentation: Vertex Exam by:: Cleone Slim, CNM   Fetal Monitoring: Baseline: 150 Variability: moderate Accelerations: 10x10 Decelerations: none Contractions: 4-10  Fern- positive  A: SIUP at [redacted]w[redacted]d  SROM  P: Report called to V. [address]  Cleone Slim [place], [place] 06/18/2023 5:14 PM

## 2023-06-18 NOTE — H&P (Signed)
 HPI: Morgan Lawson is a 38 y.o. year old [phone] female at [redacted]w[redacted]d weeks gestation who presents to MAU reporting Spontaneous rupture of membranes, swelling of feet and mild contractions. Fern + in MAU. Upon further questioning, states leaking started 2-3 days ago but was unsure if water was broken because she small amounts.   OB History     Gravida  6   Para  3   Term  3   Preterm  0   AB  2   Living  3      SAB  2   IAB  0   Ectopic  0   Multiple  0   Live Births  3          Past Medical History:  Diagnosis Date   Medical history non-contributory    Past Surgical History:  Procedure Laterality Date   NO PAST SURGERIES     Family History: family history includes Diabetes in her maternal aunt, paternal aunt, and paternal grandfather; Epilepsy in her sister. Social History:  reports that she has never smoked. She has never used smokeless tobacco. She reports that she does not drink alcohol and does not use drugs.     Maternal Diabetes: No Genetic Screening: Normal Maternal Ultrasounds/Referrals: Normal Fetal Ultrasounds or other Referrals:  None Maternal Substance Abuse:  No Significant Maternal Medications:  None Significant Maternal Lab Results:  Group B Strep negative Number of Prenatal Visits:greater than 3 verified prenatal visits Maternal Vaccinations:TDap Other Comments:  None  Review of Systems  Constitutional:  Negative for chills and fever.  Eyes:  Negative for visual disturbance.  Cardiovascular:  Positive for leg swelling.  Gastrointestinal:  Positive for abdominal pain. Negative for nausea and vomiting.  Genitourinary:  Negative for vaginal bleeding and vaginal discharge.       Leaking clear fluid  Neurological:  Negative for headaches.   Maternal Medical History:  Reason for admission: Rupture of membranes and contractions.  Nausea.  Contractions: Frequency: irregular.   Perceived severity is mild.   Fetal activity:  Perceived fetal activity is normal.   Prenatal complications: No PIH or infection.   Prenatal Complications - Diabetes: none.  Dilation: 2 Effacement (%): 50 Station: -3 Exam by:: Cleone Slim, CNM Blood pressure 130/81, pulse 83, temperature 98.4 F (36.9 C), temperature source Oral, resp. rate 16, height 4\' 11"  (1.499 m), weight 75.3 kg, SpO2 100%. Maternal Exam:  Uterine Assessment: Contraction strength is mild.  Contraction frequency is irregular.  Abdomen: Patient reports no abdominal tenderness. Estimated fetal weight is 7 lb.   Fetal presentation: vertex Pelvis: adequate for delivery.   Cervix: Cervix evaluated by digital exam.     Fetal Exam Fetal Monitor Review: Baseline rate: 150.  Variability: moderate (6-25 bpm).   Pattern: accelerations present and no decelerations.   Fetal State Assessment: Category I - tracings are normal.  Physical Exam Constitutional:      Appearance: She is well-developed.  HENT:     Head: Normocephalic.  Eyes:     Conjunctiva/sclera: Conjunctivae normal.  Cardiovascular:     Rate and Rhythm: Normal rate and regular rhythm.     Heart sounds: Normal heart sounds.  Pulmonary:     Effort: Pulmonary effort is normal. No respiratory distress.     Breath sounds: Normal breath sounds.  Abdominal:     Palpations: Abdomen is soft.     Tenderness: There is no abdominal tenderness.  Genitourinary:    Vagina: No bleeding.  Comments: Leaking clear fluid  Musculoskeletal:        General: Normal range of motion.     Cervical back: Normal range of motion and neck supple.     Right lower leg: Edema present.     Left lower leg: Edema present.  Skin:    General: Skin is warm and dry.  Neurological:     Mental Status: She is alert and oriented to person, place, and time.  Psychiatric:        Mood and Affect: Mood normal.    Prenatal labs: ABO, Rh:  O pos Antibody:  Neg Rubella:  Immune RPR:   NR HBsAg:  Neg   HIV:   Neg GBS:    Neg AFP: Nml NIPS: Nml female GTT: 152->3 hr GTT Nml Horizon: Nml  Assessment: 1. Labor: Early, SROM possibly for 2-3 days. Low suspicion for chorio due to absence of fever, abd tenderness, leukocytosis, tachycardia, although baby was mildly tachycardic initially.   2. Fetal Wellbeing: Category I  3. Pain Control: Plans comfort measures, no epidural. 4. GBS: Neg 5. 40.2 week IUP 6. Language Barrier 7. FHR initially mildly tachycardic with min-moderate variability but has improved and is now category I  Plan:  1. Admit to BS per consult with MD 2. Routine L&D orders 3. Analgesia/anesthesia PRN  4. Recommend augmentation. Start pitocin. 5. Spanish interpreter used.   Girl/Breast & Bottle/Undecided contraception  [place] 06/18/2023, 5:17 PM

## 2023-06-18 NOTE — Progress Notes (Signed)
 Brief progress note:  In to meet patient, she reports ctx not very painful, plans on unmedicated delivery. Cat I strip. On Pitocin of 2. Will cont to uptitrate to get ctx more regular.   Sundra Aland, MD OB Fellow, Faculty Practice [facility], Center for [facility]

## 2023-06-19 ENCOUNTER — Encounter (HOSPITAL_COMMUNITY): Payer: Self-pay | Admitting: Obstetrics and Gynecology

## 2023-06-19 DIAGNOSIS — O4202 Full-term premature rupture of membranes, onset of labor within 24 hours of rupture: Secondary | ICD-10-CM

## 2023-06-19 DIAGNOSIS — O480 Post-term pregnancy: Secondary | ICD-10-CM

## 2023-06-19 DIAGNOSIS — O411230 Chorioamnionitis, third trimester, not applicable or unspecified: Secondary | ICD-10-CM

## 2023-06-19 DIAGNOSIS — Z3A40 40 weeks gestation of pregnancy: Secondary | ICD-10-CM

## 2023-06-19 DIAGNOSIS — O326XX0 Maternal care for compound presentation, not applicable or unspecified: Secondary | ICD-10-CM

## 2023-06-19 LAB — RPR: RPR Ser Ql: NONREACTIVE

## 2023-06-19 MED ORDER — SODIUM CHLORIDE 0.9 % IV SOLN: 250.0000 mL | INTRAVENOUS | Status: DC | PRN

## 2023-06-19 MED ORDER — PRENATAL MULTIVITAMIN CH
1.0000 | ORAL_TABLET | Freq: Every day | ORAL | Status: DC
  Administered 2023-06-19 – 2023-06-20 (×2): 1 via ORAL
  Filled 2023-06-19 (×2): qty 1

## 2023-06-19 MED ORDER — ACETAMINOPHEN 325 MG PO TABS
650.0000 mg | ORAL_TABLET | ORAL | Status: DC | PRN
  Administered 2023-06-19 – 2023-06-20 (×2): 650 mg via ORAL
  Filled 2023-06-19 (×3): qty 2

## 2023-06-19 MED ORDER — BENZOCAINE-MENTHOL 20-0.5 % EX AERO: 1.0000 | INHALATION_SPRAY | CUTANEOUS | Status: DC | PRN

## 2023-06-19 MED ORDER — OXYCODONE HCL 5 MG PO TABS
5.0000 mg | ORAL_TABLET | Freq: Four times a day (QID) | ORAL | Status: DC | PRN
  Administered 2023-06-19 – 2023-06-20 (×2): 5 mg via ORAL
  Filled 2023-06-19 (×2): qty 1

## 2023-06-19 MED ORDER — SENNOSIDES-DOCUSATE SODIUM 8.6-50 MG PO TABS
2.0000 | ORAL_TABLET | ORAL | Status: DC
  Administered 2023-06-19 – 2023-06-20 (×2): 2 via ORAL
  Filled 2023-06-19 (×2): qty 2

## 2023-06-19 MED ORDER — IBUPROFEN 800 MG PO TABS
800.0000 mg | ORAL_TABLET | Freq: Three times a day (TID) | ORAL | Status: DC
  Administered 2023-06-19 – 2023-06-20 (×3): 800 mg via ORAL
  Filled 2023-06-19 (×3): qty 1

## 2023-06-19 MED ORDER — SODIUM CHLORIDE 0.9% FLUSH: 3.0000 mL | INTRAVENOUS | Status: DC | PRN

## 2023-06-19 MED ORDER — MEDROXYPROGESTERONE ACETATE 150 MG/ML IM SUSP: 150.0000 mg | INTRAMUSCULAR | Status: DC | PRN

## 2023-06-19 MED ORDER — COCONUT OIL OIL: 1.0000 | TOPICAL_OIL | Status: DC | PRN

## 2023-06-19 MED ORDER — DIPHENHYDRAMINE HCL 25 MG PO CAPS: 25.0000 mg | ORAL_CAPSULE | Freq: Four times a day (QID) | ORAL | Status: DC | PRN

## 2023-06-19 MED ORDER — WITCH HAZEL-GLYCERIN EX PADS: 1.0000 | MEDICATED_PAD | CUTANEOUS | Status: DC | PRN

## 2023-06-19 MED ORDER — SIMETHICONE 80 MG PO CHEW: 80.0000 mg | CHEWABLE_TABLET | ORAL | Status: DC | PRN

## 2023-06-19 MED ORDER — SODIUM CHLORIDE 0.9% FLUSH: 3.0000 mL | Freq: Two times a day (BID) | INTRAVENOUS | Status: DC

## 2023-06-19 MED ORDER — ONDANSETRON HCL 4 MG PO TABS: 4.0000 mg | ORAL_TABLET | ORAL | Status: DC | PRN

## 2023-06-19 MED ORDER — ONDANSETRON HCL 4 MG/2ML IJ SOLN: 4.0000 mg | INTRAMUSCULAR | Status: DC | PRN

## 2023-06-19 MED ORDER — DIBUCAINE (PERIANAL) 1 % EX OINT: 1.0000 | TOPICAL_OINTMENT | CUTANEOUS | Status: DC | PRN

## 2023-06-19 NOTE — Progress Notes (Signed)
 Brief progress note:  Having some early decels, otherwise reassuring strip. Ctx spacing out, will cont to uptitrate Pitocin. Plan to recheck cervix once ctx regular for a couple of hours. Discussed plan of care with bedside RN.  Sundra Aland, MD OB Fellow, Faculty Practice [facility], Center for [facility]

## 2023-06-19 NOTE — Progress Notes (Signed)
 Brief progress note:  In to evaluate pt for IUPC -- contractions somewhat difficult to trace, discussed placement of IUPC with pt to allow for better titration of Pitocin. IUPC placed without difficulty, pt tolerated well. Cat I tracing. Will better assess ctx pattern and continue to [facility] accordingly.   Sundra Aland, MD OB Fellow, Faculty Practice [facility], Center for [facility]

## 2023-06-19 NOTE — Progress Notes (Signed)
 Brief progress note:  In to check on patient -- feeling more pressure now. Appears comfortable, standing at bedside. Cat I strip. Ctx now every 2-4 min. Cervix 3.5-4in, slightly more effaced. Will cont to upitrate Pitocin.  Sundra Aland, MD OB Fellow, Faculty Practice [facility], Center for [facility]

## 2023-06-19 NOTE — Discharge Summary (Signed)
 Postpartum Discharge Summary  Date of Service updated***     Patient Name: Morgan Lawson DOB: 06-05-1985 MRN: [id number]  Date of admission: 06/18/2023 Delivery date:06/19/2023 Delivering provider: Sundra Aland Date of discharge: 06/19/2023  Admitting diagnosis: Leakage of amniotic fluid [O42.90] Intrauterine pregnancy: [redacted]w[redacted]d     Secondary diagnosis:  Principal Problem:   SVD (spontaneous vaginal delivery) Active Problems:   Leakage of amniotic fluid  Additional problems: ***    Discharge diagnosis: Term Pregnancy Delivered                                              Post partum procedures:{Postpartum procedures:23558} Augmentation: AROM Complications: {OB Labor/Delivery Complications:20784}  Hospital course: Onset of Labor With Vaginal Delivery      38 y.o. yo [id number] at [redacted]w[redacted]d was admitted for SROM on 06/18/2023. Labor course was complicated by prolonged rupture of membranes.  Membrane Rupture Time/Date: 3:00 PM,06/15/2023  Delivery Method:Vaginal, Spontaneous Operative Delivery:N/A Episiotomy: None Lacerations:  Labial Patient had a postpartum course complicated by ***.  She is ambulating, tolerating a regular diet, passing flatus, and urinating well. Patient is discharged home in stable condition on 06/19/23.  Newborn Data: Birth date:06/19/2023 Birth time:4:51 AM Gender:Female Living status:Living Apgars:9 ,9  Weight:   Magnesium Sulfate received: {Mag received:30440022} BMZ received: No Rhophylac:N/A MMR:N/A T-DaP:Given prenatally Flu: No RSV Vaccine received: No Transfusion:{Transfusion received:30440034}  Immunizations received: Immunization History  Administered Date(s) Administered   Tdap 02/11/2020    Physical exam  Vitals:   06/19/23 0401 06/19/23 0434 06/19/23 0501 06/19/23 0506  BP: 130/75 124/69 114/72   Pulse: (!) 57 (!) 52 68   Resp:      Temp:      TempSrc:      SpO2:   100% 100%  Weight:      Height:       General:  {Exam; general:21111117} Lochia: {Desc; appropriate/inappropriate:30686::"appropriate"} Uterine Fundus: {Desc; firm/soft:30687} Incision: {Exam; incision:21111123} DVT Evaluation: {Exam; dvt:2111122} Labs: Lab Results  Component Value Date   WBC 9.8 06/18/2023   HGB 13.5 06/18/2023   HCT 39.4 06/18/2023   MCV 86.0 06/18/2023   PLT 256 06/18/2023      Latest Ref Rng & Units 08/13/2014   10:00 AM  CMP  Glucose 65 - 99 mg/dL 86   BUN 6 - 20 mg/dL 9   Creatinine 4.40 - 1.02 mg/dL 7.25   Sodium 366 - 440 mmol/L 136   Potassium 3.5 - 5.1 mmol/L 3.9   Chloride 101 - 111 mmol/L 106   CO2 22 - 32 mmol/L 22   Calcium 8.9 - 10.3 mg/dL 9.1    Edinburgh Score:    05/19/2020   11:30 PM  Edinburgh Postnatal Depression Scale Screening Tool  I have been able to laugh and see the funny side of things. 1  I have looked forward with enjoyment to things. 2  I have blamed myself unnecessarily when things went wrong. 1  I have been anxious or worried for no good reason. 2  I have felt scared or panicky for no good reason. 2  Things have been getting on top of me. 1  I have been so unhappy that I have had difficulty sleeping. 1  I have felt sad or miserable. 1  I have been so unhappy that I have been crying. 1  The thought of harming myself  has occurred to me. 0  Edinburgh Postnatal Depression Scale Total 12   No data recorded  After visit meds:  Allergies as of 06/19/2023   No Known Allergies   Med Rec must be completed prior to using this [facility]***        Discharge home in stable condition Infant Feeding: {Baby feeding:23562} Infant Disposition:{CHL IP OB HOME WITH YQIHKV:42595} Discharge instruction: per After Visit Summary and Postpartum booklet. Activity: Advance as tolerated. Pelvic rest for 6 weeks.  Diet: {OB GLOV:56433295} Future Appointments:No future appointments. Follow up Visit: Message sent to [facility] 4/7  Please schedule this patient for a In person postpartum  visit in 6 weeks with the following provider: Any provider. Additional Postpartum F/U:Postpartum Depression checkup  Low risk pregnancy complicated by: hx PPD Delivery mode:  Vaginal, Spontaneous Anticipated Birth Control:  Unsure   06/19/2023 Sundra Aland, MD

## 2023-06-19 NOTE — Lactation Note (Signed)
 This note was copied from a baby's chart. Lactation Consultation Note  Patient Name: Morgan Lawson [id number] Date: 06/19/2023 Age:39 hours Reason for consult: Initial assessment  P4, 40 wks, @ 9 hrs of life. Video interpreter- [phone] August used. Per charting- mom said No to LC- rounded to touch base. Mom said she is good for now. Encouraged her she could call anytime assist desired. Encouraged mom to ask for coconut oil if nipple soreness occurs. LC services and milk storage handouts shared with mom. Per mom she does have a manual pump.    Feeding Mother's Current Feeding Choice: Breast Milk and Formula  LATCH Score Latch: Grasps breast easily, tongue down, lips flanged, rhythmical sucking.  Audible Swallowing: A few with stimulation  Type of Nipple: Everted at rest and after stimulation  Comfort (Breast/Nipple): Soft / non-tender  Hold (Positioning): Assistance needed to correctly position infant at breast and maintain latch.  LATCH Score: 8   Lactation Tools Discussed/Used    Interventions Interventions: Education;LC Services brochure;CDC milk storage guidelines  Discharge Pump: Manual;Personal (Per mom has a hand pump @ home)  Consult Status Consult Status: Complete (mother declined follow up) Date: 06/19/23 Follow-up type: In-patient    [facility] 06/19/2023, 2:29 PM

## 2023-06-20 NOTE — Progress Notes (Signed)
Ordered pt meals, by Viria Alvarez Spanish Medical Interpreter. 

## 2023-06-23 ENCOUNTER — Inpatient Hospital Stay (HOSPITAL_COMMUNITY): Payer: Self-pay

## 2023-06-23 ENCOUNTER — Inpatient Hospital Stay (HOSPITAL_COMMUNITY): Admission: RE | Admit: 2023-06-23 | Payer: Self-pay | Source: Ambulatory Visit | Admitting: Obstetrics and Gynecology

## 2023-06-29 ENCOUNTER — Telehealth (HOSPITAL_COMMUNITY): Payer: Self-pay | Admitting: *Deleted

## 2023-06-29 NOTE — Telephone Encounter (Signed)
 Attempted hospital discharge follow-up call with Language Line interpreter. Left message for patient to return RN call with any questions or concerns. Julien Odor, RN, 06/29/23, [phone]
